# Patient Record
Sex: Female | Born: 1940 | Race: White | Hispanic: No | Marital: Married | Smoking: Never smoker
Health system: Southern US, Community
[De-identification: ages and names within clinical notes are randomized; demographics above are authoritative.]

## PROBLEM LIST (undated history)

## (undated) DIAGNOSIS — K222 Esophageal obstruction: Secondary | ICD-10-CM

## (undated) DIAGNOSIS — M1990 Unspecified osteoarthritis, unspecified site: Secondary | ICD-10-CM

## (undated) DIAGNOSIS — I8390 Asymptomatic varicose veins of unspecified lower extremity: Secondary | ICD-10-CM

## (undated) DIAGNOSIS — K449 Diaphragmatic hernia without obstruction or gangrene: Secondary | ICD-10-CM

## (undated) DIAGNOSIS — R42 Dizziness and giddiness: Secondary | ICD-10-CM

## (undated) DIAGNOSIS — K219 Gastro-esophageal reflux disease without esophagitis: Secondary | ICD-10-CM

## (undated) HISTORY — PX: BLADDER SUSPENSION: SHX72

## (undated) HISTORY — DX: Gastro-esophageal reflux disease without esophagitis: K21.9

## (undated) HISTORY — DX: Esophageal obstruction: K22.2

## (undated) HISTORY — PX: VAGINAL HYSTERECTOMY: SHX2639

## (undated) HISTORY — DX: Asymptomatic varicose veins of unspecified lower extremity: I83.90

## (undated) HISTORY — DX: Diaphragmatic hernia without obstruction or gangrene: K44.9

## (undated) HISTORY — DX: Dizziness and giddiness: R42

## (undated) HISTORY — DX: Unspecified osteoarthritis, unspecified site: M19.90

---

## 2007-04-24 ENCOUNTER — Ambulatory Visit: Payer: Self-pay | Admitting: Gastroenterology

## 2007-05-03 ENCOUNTER — Encounter: Payer: Self-pay | Admitting: Gastroenterology

## 2007-05-03 ENCOUNTER — Ambulatory Visit: Payer: Self-pay | Admitting: Gastroenterology

## 2007-05-03 DIAGNOSIS — K222 Esophageal obstruction: Secondary | ICD-10-CM | POA: Insufficient documentation

## 2007-06-26 DIAGNOSIS — K219 Gastro-esophageal reflux disease without esophagitis: Secondary | ICD-10-CM | POA: Insufficient documentation

## 2007-06-26 DIAGNOSIS — K449 Diaphragmatic hernia without obstruction or gangrene: Secondary | ICD-10-CM | POA: Insufficient documentation

## 2007-06-26 DIAGNOSIS — R42 Dizziness and giddiness: Secondary | ICD-10-CM | POA: Insufficient documentation

## 2007-06-26 DIAGNOSIS — J309 Allergic rhinitis, unspecified: Secondary | ICD-10-CM | POA: Insufficient documentation

## 2007-06-26 DIAGNOSIS — R1310 Dysphagia, unspecified: Secondary | ICD-10-CM | POA: Insufficient documentation

## 2008-06-03 ENCOUNTER — Encounter: Admission: RE | Admit: 2008-06-03 | Discharge: 2008-06-03 | Payer: Self-pay | Admitting: Family Medicine

## 2008-06-05 ENCOUNTER — Ambulatory Visit (HOSPITAL_COMMUNITY): Admission: RE | Admit: 2008-06-05 | Discharge: 2008-06-06 | Payer: Self-pay | Admitting: Obstetrics and Gynecology

## 2009-12-17 ENCOUNTER — Encounter (INDEPENDENT_AMBULATORY_CARE_PROVIDER_SITE_OTHER): Payer: Self-pay | Admitting: *Deleted

## 2010-01-06 ENCOUNTER — Ambulatory Visit: Payer: Self-pay | Admitting: Cardiology

## 2010-01-09 ENCOUNTER — Encounter (INDEPENDENT_AMBULATORY_CARE_PROVIDER_SITE_OTHER): Payer: Self-pay | Admitting: *Deleted

## 2010-06-30 NOTE — Letter (Signed)
Summary: Previsit letter  [facility]  [address] [place], [place] [address]   Phone: [phone]  Fax: [phone]       12/17/2009 MRN: [id number]  [facility] [address] [place], [place]  [address]  Dear Ms. [name] [name],  Welcome to the Gastroenterology Division at [facility].    You are scheduled to see a nurse for your pre-procedure visit on January 20, 2010 at 10:30am on the 3rd floor at [facility], [address]  We ask that you try to arrive at our office 15 minutes prior to your appointment time to allow for check-in.  Your nurse visit will consist of discussing your medical and surgical history, your immediate family medical history, and your medications.    Please bring a complete list of all your medications or, if you prefer, bring the medication bottles and we will list them.  We will need to be aware of both prescribed and over the counter drugs.  We will need to know exact dosage information as well.  If you are on blood thinners (Coumadin, Plavix, Aggrenox, Ticlid, etc.) please call our office today/prior to your appointment, as we need to consult with your physician about holding your medication.   Please be prepared to read and sign documents such as consent forms, a financial agreement, and acknowledgement forms.  If necessary, and with your consent, a friend or relative is welcome to sit-in on the nurse visit with you.  Please bring your insurance card so that we may make a copy of it.  If your insurance requires a referral to see a specialist, please bring your referral form from your primary care physician.  No co-pay is required for this nurse visit.     If you cannot keep your appointment, please call [phone] to cancel or reschedule prior to your appointment date.  This allows [place] the opportunity to schedule an appointment for another patient in need of care.    Thank you for choosing [facility] for your medical  needs.  We appreciate the opportunity to care for you.  Please visit [place] at our website  to learn more about our practice.                     Sincerely.                                                                                                                   The Gastroenterology Division

## 2010-06-30 NOTE — Letter (Signed)
Summary: [facility] Cancel and No Reschedule  [facility]  [address] [place], [place] [address]   Phone: [phone]  Fax: [phone]      January 09, 2010 MRN: [id number]   [facility] [address] [place], [place]  [address]     You recently cancelled your endoscopic procedure at the [facility] and did not reschedule for another date.    Your provider recommended this procedure for the benefit of your health.  It is very important that you reschedule it.  Failure to do so may be to the detriment of your health.  Please call us at [phone] and we will be happy to assist you with rescheduling.    If you were referred for this procedure by another physician/provider, we will notify him/her that you did not keep your appointment.   Sincerely,   Endoscopy Center

## 2010-06-30 NOTE — Procedures (Signed)
Summary: Gastroenterology EGD  Gastroenterology EGD   Imported By: Christie Nottingham 06/29/2007 08:13:42  _____________________________________________________________________  External Attachment:    Type:   Image     Comment:   External Document

## 2010-09-14 LAB — CBC
HCT: 33.4 % — ABNORMAL LOW (ref 36.0–46.0)
HCT: 40.3 % (ref 36.0–46.0)
Hemoglobin: 11.4 g/dL — ABNORMAL LOW (ref 12.0–15.0)
Hemoglobin: 13.5 g/dL (ref 12.0–15.0)
MCHC: 33.5 g/dL (ref 30.0–36.0)
MCHC: 34.1 g/dL (ref 30.0–36.0)
MCV: 95 fL (ref 78.0–100.0)
MCV: 95.4 fL (ref 78.0–100.0)
Platelets: 223 10*3/uL (ref 150–400)
Platelets: 257 10*3/uL (ref 150–400)
RBC: 3.5 MIL/uL — ABNORMAL LOW (ref 3.87–5.11)
RBC: 4.24 MIL/uL (ref 3.87–5.11)
RDW: 13 % (ref 11.5–15.5)
RDW: 13.2 % (ref 11.5–15.5)
WBC: 15.1 10*3/uL — ABNORMAL HIGH (ref 4.0–10.5)
WBC: 7.6 10*3/uL (ref 4.0–10.5)

## 2010-10-13 NOTE — Op Note (Signed)
NAME:  [name] [name], [name] [name]                  ACCOUNT NO.:  [id number]   MEDICAL RECORD NO.:  [id number]          PATIENT TYPE:  OIB   LOCATION:  9302                          FACILITY:  [facility]   PHYSICIAN:  Leighton Roach Meisinger, M.D.DATE OF BIRTH:  05-Oct-1940   DATE OF PROCEDURE:  DATE OF DISCHARGE:                               OPERATIVE REPORT   PREOPERATIVE DIAGNOSES:  Cystocele and rectocele.   POSTOPERATIVE DIAGNOSES:  Cystocele and rectocele.   PROCEDURE:  Anterior and posterior colporrhaphy and cystoscopy.   SURGEON:  Zenaida Niece, MD   ASSISTANT:  Malachi Pro. Ambrose Mantle, MD   ANESTHESIA:  General with an LMA.   FINDINGS:  She had a grade 2 cystocele that was more toward the vaginal  cuff and a grade 1 rectocele.   SPECIMENS:  None.   ESTIMATED BLOOD LOSS:  200 mL.   COMPLICATIONS:  None.   PROCEDURE IN DETAIL:  The patient was taken to the operating room and  placed in the dorsal supine position.  General anesthesia was induced  and she was placed in mobile stirrups.  Perineum and vagina were then  prepped and draped in the usual sterile fashion and bladder drained with  a latex free catheter.  A short weighted speculum was then inserted into  the vagina and a Deaver retractor used anteriorly.  The vaginal cuff was  a little bit difficult to identify, but I grabbed what I thought was the  cuff with two Allis clamps.  A piece of vaginal mucosa was then removed  sharply.  The anterior vagina was then dissected in the midline from the  vaginal cuff within 2 cm of the urethral meatus.  This was done in the  midline.  Cystocele was then mobilized laterally with sharp and blunt  dissection.  There was a fairly large bulge towards the vaginal cuff  that appeared to be possibly an enterocele.  It was difficult to  identify what it was.  At this point, cystoscopy was performed with a 70-  degree cystoscope.  The bladder was instilled with approximately 200 mL  of sterile fluid.  While  looking in the bladder and pushing up on this  bulge, it was apparent that it was part of the cystocele.  The  cystoscope was removed after the bladder was drained.  The cystocele was  then reduced with interrupted sutures of 2-0 Vicryl with good reduction.  Bleeding was controlled with electrocautery.  Excess vaginal mucosa was  then removed sharply.  The anterior vaginal mucosa was then closed in a  running locking fashion with 2-0 Vicryl.  A small amount of bleeding at  the vaginal cuff was controlled with a figure-of-eight suture of 2-0  Vicryl.  This appeared to have good reduction of the cystocele and  adequate hemostasis.   Attention was turned posteriorly.  There did appear to be a grade 1  rectocele.  The hymenal ring was grasped with two Allis clamps.  A piece  of mucosa was then removed sharply.  The vaginal mucosa was dissected  from the hymenal ring to  the vaginal cuff in the midline.  Rectocele was  then mobilized laterally both sharply and bluntly.  A rectal exam  confirmed that this was a small rectocele and no evidence of enterocele.  The rectocele was then reduced with interrupted sutures of 2-0 Vicryl.  Vaginal mucosa was then trimmed sharply.  Bleeding was controlled with  electrocautery.  Vaginal mucosa was then closed with running locking 2-0  Vicryl with adequate closure and adequate hemostasis.  No significant  bleeding was noted.  Cystoscopy was again performed and noted good  reduction of the cystocele and urine was seen to flow from each ureteral  orifice.  Cystoscope was removed and a Foley catheter was placed.  The  vagina was then packed with 2-inch gauze with estrogen cream on it.  All  instruments were then removed from the vagina.  The patient was taken  down from stirrups.  She was awakened in the operating room and taken to  the recovery room in stable condition after tolerating the procedure  well.  Counts were correct, she received Ancef 1 g IV at the  beginning  of the procedure and had PAS hose on throughout the procedure.      Zenaida Niece, M.D.  Electronically Signed     TDM/MEDQ  D:  06/05/2008  T:  06/05/2008  Job:  [id number]

## 2010-10-13 NOTE — H&P (Signed)
NAME:  [name] [name], [name] [name]                  ACCOUNT NO.:  [id number]   MEDICAL RECORD NO.:  [id number]          PATIENT TYPE:  AMB   LOCATION:  SDC                           FACILITY:  [facility]   PHYSICIAN:  Zenaida Niece, M.D.DATE OF BIRTH:  [birth year]   DATE OF ADMISSION:  DATE OF DISCHARGE:                              HISTORY & PHYSICAL   CHIEF COMPLAINT:  Cystocele.   HISTORY OF PRESENT ILLNESS:  This is a 70 year old female, gravida 4,  para 3-0-1-3, whom I saw for an annual exam in April 2009.  At that  time, she was complaining of increased urinary urgency with leaking and  the fact that she was leaking with bending over and with increasing  pelvic pressure.  On physical exam, she was noted to have a grade 2+  cystocele.  Options were discussed and at that point the patient wished  to think about it.  Her urinalysis was normal and she was thought to  probably have overactive bladder.  She was placed on Detrol and this did  help some with her symptoms.  She called in October 2009, saying that  she wanted to have surgery for her urine leakage.  She had urodynamics  performed which were more consistent with overactive bladder and not  consistent with stress incontinence.  I advised her that her cystocele  may be contributing to her overactive bladder and offered again pessary  or surgery and she is being admitted for repair of her cystocele.   PAST OB HISTORY:  Significant for three vaginal deliveries.   PAST MEDICAL HISTORY:  1. Arthritis.  2. Gastroesophageal reflux disease.  3. Esophageal restriction.   PAST SURGICAL HISTORY:  Previous vaginal hysterectomy with anterior  repair.   ALLERGIES:  STREPTOMYCIN.   CURRENT MEDICATIONS:  Detrol and Nexium, as well as vitamins.   REVIEW OF SYSTEMS:  No chest pain or shortness of breath.  She does have  some increased frequency with bowel movements.   FAMILY HISTORY:  Sister with breast cancer and niece with colon cancer.   SOCIAL  HISTORY:  She denies alcohol, tobacco or drug use and she is  married.   PHYSICAL EXAMINATION:  GENERAL:  This is a well-developed female in no  acute distress.  Weight is 173 pounds.  Blood pressure was 110/80.  NECK:  Supple without lymphadenopathy or thyromegaly.  LUNGS:  Clear to auscultation.  HEART:  Regular rate and rhythm without murmur.  ABDOMEN:  Soft, nontender, nondistended without palpable masses.  EXTREMITIES:  Have no edema and are nontender.  PELVIC EXAM:  External genitalia has no lesions.  On speculum exam, the  vagina is normal except for a grade 2+ cystocele and some atrophy.  On  bimanual exam there are no masses.  She is nontender and she does have a  poor Kegel contraction.   ASSESSMENT:  Symptomatic cystocele with overactive bladder.  All  nonsurgical and surgical options have been discussed with the patient  and she wishes to proceed with surgical therapy.  All risks of surgery  have been discussed.   PLAN:  The plan is to admit the patient on the day of surgery for  anterior colporrhaphy and posterior colporrhaphy if needed.      Zenaida Niece, M.D.  Electronically Signed     TDM/MEDQ  D:  06/04/2008  T:  06/04/2008  Job:  [id number]

## 2010-10-13 NOTE — Assessment & Plan Note (Signed)
[name] [name]                         GASTROENTEROLOGY OFFICE NOTE   NAME:[name], [name] GHRIST                         MRN:          [id number]  DATE:04/24/2007                            DOB:          1940/10/06    REFERRING PHYSICIAN:  Quita Skye. Artis Flock, M.D.   REASON FOR CONSULTATION:  Dysphagia and reflux symptoms.   HISTORY OF PRESENT ILLNESS:  [name] [name] is a 70 year old white female who  relates reflux problems since 1998. She states that she had an upper  endoscopy or an upper GI series performed in about 1998 that showed a  hiatal hernia. She has intermittent problems with nighttime reflux  generally related to eating late at night or eating spicy foods. These  problems have been relatively stable for many years and she has not used  a routine medication for management. Over the past 12 months, she has  had worsening problems with solid food dysphagia. Her reflux symptoms  have not substantially changed. She saw Dr. Artis Flock with the above  complaints approximately two weeks ago and she was started on Nexium and  she has had an improvement in all of her symptoms. She notes no  odynophagia, weight loss, nausea, vomiting, abdominal pain, melena,  hematochezia or change in stool caliber. There is no family history of  colon cancer or colon polyps or inflammatory bowel disease.   PAST MEDICAL HISTORY:  1. Allergic rhinitis.  2. Vertigo.  3. Gastroesophageal reflux disease.  4. Hiatal hernia.   CURRENT MEDICATIONS:  As listed on the chart; updated and reviewed.   MEDICATION ALLERGIES:  STREPTOMYCIN LEADING TO A RASH.   SOCIAL HISTORY:  Per the handwritten form.   REVIEW OF SYSTEMS:  Per the handwritten form.   PHYSICAL EXAMINATION:  Well-developed, well-nourished white female in no  acute distress. Height 5 feet, 2 inches. Weight 176.6 pounds. Blood  pressure is 110/60, pulse 78 and regular.  HEENT: Anicteric sclerae. Oropharynx clear.  CHEST: Clear  to auscultation bilaterally.  CARDIAC: Regular rate and rhythm without murmurs appreciated.  ABDOMEN: Soft and nontender. Nondistended. Normoactive bowel sounds. No  palpable organomegaly, masses or hernias.  RECTAL: Deferred.  EXTREMITIES: Without clubbing, cyanosis or edema.  NEUROLOGIC: Alert and oriented x3. Grossly nonfocal.   ASSESSMENT/PLAN:  1. Gastroesophageal reflux disease with predominantly nighttime      symptoms and solid food dysphagia, rule out an esophageal      stricture, esophagitis, Barretts and other disorders. Continue      Nexium 40 mg p.o. q a.m. along with standard anti-reflux measures.      Risks, benefits and alternatives to upper endoscopy with possible      biopsy and possible dilatation discussed with the patient. She      consents to proceed. This will be scheduled electively.  2. Colorectal cancer screening. Will discuss a screening colonoscopy      with her at the time of her next visit.     Venita Lick. Russella Dar, MD, [facility]  Electronically Signed    MTS/MedQ  DD: 04/24/2007  DT: 04/24/2007  Job #: [id number]   cc:  Quita Skye Artis Flock, M.D.

## 2011-01-04 ENCOUNTER — Ambulatory Visit: Payer: Medicare Other | Attending: Otolaryngology | Admitting: Rehabilitative and Restorative Service Providers"

## 2011-01-04 DIAGNOSIS — R269 Unspecified abnormalities of gait and mobility: Secondary | ICD-10-CM | POA: Insufficient documentation

## 2011-01-04 DIAGNOSIS — R42 Dizziness and giddiness: Secondary | ICD-10-CM | POA: Insufficient documentation

## 2011-01-04 DIAGNOSIS — IMO0001 Reserved for inherently not codable concepts without codable children: Secondary | ICD-10-CM | POA: Insufficient documentation

## 2011-01-07 ENCOUNTER — Ambulatory Visit: Payer: Medicare Other | Admitting: Rehabilitative and Restorative Service Providers"

## 2011-01-12 ENCOUNTER — Encounter: Payer: Medicare Other | Admitting: Rehabilitative and Restorative Service Providers"

## 2011-01-14 ENCOUNTER — Ambulatory Visit: Payer: Medicare Other | Admitting: Physical Therapy

## 2011-01-19 ENCOUNTER — Ambulatory Visit: Payer: Medicare Other | Admitting: Rehabilitative and Restorative Service Providers"

## 2011-01-26 ENCOUNTER — Encounter: Payer: Medicare Other | Admitting: Rehabilitative and Restorative Service Providers"

## 2011-03-30 ENCOUNTER — Ambulatory Visit: Payer: Medicare Other | Admitting: Cardiology

## 2011-04-26 ENCOUNTER — Telehealth: Payer: Self-pay | Admitting: Cardiology

## 2011-04-26 ENCOUNTER — Ambulatory Visit: Payer: Medicare Other | Admitting: Cardiology

## 2011-04-26 NOTE — Telephone Encounter (Signed)
Started yesterday with cold chills, fever last night, some diarrhea,  and headache.  Denies fever today, cough, congestion, or sore throat.  Stated she did get the flu vaccine.  Advised to continue to drink plenty of fluids, use Tylenol as needed and if worse go to urgent care

## 2011-04-26 NOTE — Telephone Encounter (Signed)
Agree with plan 

## 2011-04-26 NOTE — Telephone Encounter (Signed)
New Msg: Pt calling to cancel appt for today stating that pt has virus. Pt wanted to know if rx could be called in to assist with pt getting over virus. Please return pt call to discuss further if necessary.

## 2011-04-27 ENCOUNTER — Ambulatory Visit: Payer: Medicare Other | Admitting: Cardiology

## 2011-04-27 ENCOUNTER — Other Ambulatory Visit: Payer: Self-pay | Admitting: Cardiology

## 2011-04-27 DIAGNOSIS — R197 Diarrhea, unspecified: Secondary | ICD-10-CM

## 2011-04-27 MED ORDER — DIPHENOXYLATE-ATROPINE 2.5-0.025 MG PO TABS: 1.0000 | ORAL_TABLET | Freq: Four times a day (QID) | ORAL | Status: AC | PRN

## 2011-04-27 NOTE — Telephone Encounter (Signed)
New message:  Pt has been sick since Sunday.  She would like to know what she can take for the diarrhea.  She has been taking Imodium but not much better.  Please call and advise what she can take

## 2011-04-27 NOTE — Telephone Encounter (Signed)
Try Lomotil one every 4 hours when necessary for diarrhea #20.  If she fails to improve she will need to go to urgent care or to her primary care provider

## 2011-04-27 NOTE — Telephone Encounter (Signed)
Advised and called to pharmacy

## 2011-04-27 NOTE — Telephone Encounter (Signed)
Please advise 

## 2011-04-28 ENCOUNTER — Ambulatory Visit: Payer: Medicare Other | Admitting: Cardiology

## 2011-05-31 ENCOUNTER — Encounter: Payer: Self-pay | Admitting: Cardiology

## 2011-06-02 ENCOUNTER — Encounter: Payer: Self-pay | Admitting: Cardiology

## 2011-06-07 ENCOUNTER — Ambulatory Visit (INDEPENDENT_AMBULATORY_CARE_PROVIDER_SITE_OTHER): Payer: Medicare Other | Admitting: Cardiology

## 2011-06-07 ENCOUNTER — Encounter: Payer: Self-pay | Admitting: Cardiology

## 2011-06-07 VITALS — BP 100/60 | HR 80 | Ht 62.0 in | Wt 166.0 lb

## 2011-06-07 DIAGNOSIS — E7800 Pure hypercholesterolemia, unspecified: Secondary | ICD-10-CM | POA: Insufficient documentation

## 2011-06-07 NOTE — Assessment & Plan Note (Signed)
The patient has a past history of hypercholesterolemia.  The last time we checked it was in July 2011 at which time her LDL cholesterol was 152.  She had a prior [place] and MR study showing that her LDL was a large type.  She has not had to be on statin therapy.  Does not have a history of diabetes.  Her parents did not have premature coronary disease.  Her father died of kidney failure at age 73 her mother died at age 91 of old age

## 2011-06-07 NOTE — Progress Notes (Signed)
[name] [name] Date of Birth:  06-May-1941 [facility] [address] [place], [place]  [address] [phone]         Fax   [phone]  History of Present Illness: This pleasant 71 year old woman is seen for a scheduled followup office visit she has been in good general health.  She does have a history of high cholesterol.  She does not have any history of known ischemic heart disease.  Her last saw her in August 2011.  He does have a past history of severe vertigo.  Does not have any history palpitations or syncope.  No history of congestive heart failure.  Current Outpatient Prescriptions  Medication Sig Dispense Refill  . Cholecalciferol (VITAMIN D PO) Take by mouth daily.        Marland Kitchen CRANBERRY EXTRACT PO Take by mouth.        . esomeprazole (NEXIUM) 40 MG capsule Take 40 mg by mouth daily before breakfast.        . Multiple Vitamin (STRESS B PO) Take by mouth. Taking 3 daily       . tolterodine (DETROL) 1 MG tablet Take 1 mg by mouth daily.          Allergies  Allergen Reactions  . Streptomycin     Patient Active Problem List  Diagnoses  . ALLERGIC RHINITIS  . ESOPHAGEAL STRICTURE  . GASTROESOPHAGEAL REFLUX DISEASE  . HIATAL HERNIA  . VERTIGO  . DYSPHAGIA UNSPECIFIED  . Pure hypercholesterolemia    History  Smoking status  . Never Smoker   Smokeless tobacco  . Not on file    History  Alcohol Use No    Family History  Problem Relation Age of Onset  . Cancer Sister     Breast Cancer    Review of Systems: Constitutional: no fever chills diaphoresis or fatigue or change in weight.  Head and neck: no hearing loss, no epistaxis, no photophobia or visual disturbance. Respiratory: No cough, shortness of breath or wheezing. Cardiovascular: No chest pain peripheral edema, palpitations. Gastrointestinal: No abdominal distention, no abdominal pain, no change in bowel habits hematochezia or melena. Genitourinary: No dysuria, no frequency, no  urgency, no nocturia. Musculoskeletal:No arthralgias, no back pain, no gait disturbance or myalgias. Neurological: No dizziness, no headaches, no numbness, no seizures, no syncope, no weakness, no tremors. Hematologic: No lymphadenopathy, no easy bruising. Psychiatric: No confusion, no hallucinations, no sleep disturbance.    Physical Exam: Filed Vitals:   06/07/11 1521  BP: 100/60  Pulse: 80   the general appearance reveals a well-developed well-nourished woman in no distress.Pupils equal and reactive.   Extraocular Movements are full.  There is no scleral icterus.  The mouth and pharynx are normal.  The neck is supple.  The carotids reveal no bruits.  The jugular venous pressure is normal.  The thyroid is not enlarged.  There is no lymphadenopathy.  The chest is clear to percussion and auscultation. There are no rales or rhonchi. Expansion of the chest is symmetrical.  The precordium is quiet.  The first heart sound is normal.  The second heart sound is physiologically split.  There is no murmur gallop rub or click.  There is no abnormal lift or heave.  The abdomen is soft and nontender. Bowel sounds are normal. The liver and spleen are not enlarged. There Are no abdominal masses. There are no bruits.  The pedal pulses are good.  There is no phlebitis or edema.  There is no cyanosis  or clubbing. Strength is normal and symmetrical in all extremities.  There is no lateralizing weakness.  There are no sensory deficits.  The skin is warm and dry.  There is no rash.  EKG today shows normal sinus rhythm and nonspecific T-wave abnormalities not significantly changed from the previous EKG of 12/09/09  Assessment / Plan: We're checking blood work today including lipid panel hepatic function panel is a metabolic panel and CBC.  She is to work harder on weight loss and low cholesterol diet.  She is to be rechecked in one year for followup office visit and EKG.  No new meds are prescribed at this  point.

## 2011-06-07 NOTE — Patient Instructions (Signed)
Will obtain labs today and call you with the results Your physician recommends that you continue on your current medications as directed. Please refer to the Current Medication list given to you today. Your physician wants you to follow-up in: 1 year You will receive a reminder letter in the mail two months in advance. If you don't receive a letter, please call our office to schedule the follow-up appointment.

## 2011-06-08 LAB — CBC WITH DIFFERENTIAL/PLATELET
Basophils Absolute: 0 10*3/uL (ref 0.0–0.1)
Basophils Relative: 0.5 % (ref 0.0–3.0)
Eosinophils Absolute: 0.1 10*3/uL (ref 0.0–0.7)
Eosinophils Relative: 1.5 % (ref 0.0–5.0)
HCT: 40.8 % (ref 36.0–46.0)
Hemoglobin: 13.9 g/dL (ref 12.0–15.0)
Lymphocytes Relative: 27 % (ref 12.0–46.0)
Lymphs Abs: 2.4 10*3/uL (ref 0.7–4.0)
MCHC: 33.9 g/dL (ref 30.0–36.0)
MCV: 93.4 fl (ref 78.0–100.0)
Monocytes Absolute: 0.7 10*3/uL (ref 0.1–1.0)
Monocytes Relative: 7.6 % (ref 3.0–12.0)
Neutro Abs: 5.6 10*3/uL (ref 1.4–7.7)
Neutrophils Relative %: 63.4 % (ref 43.0–77.0)
Platelets: 248 10*3/uL (ref 150.0–400.0)
RBC: 4.37 Mil/uL (ref 3.87–5.11)
RDW: 13.9 % (ref 11.5–14.6)
WBC: 8.8 10*3/uL (ref 4.5–10.5)

## 2011-06-08 LAB — BASIC METABOLIC PANEL
BUN: 10 mg/dL (ref 6–23)
CO2: 29 mEq/L (ref 19–32)
Calcium: 9.4 mg/dL (ref 8.4–10.5)
Chloride: 105 mEq/L (ref 96–112)
Creatinine, Ser: 0.7 mg/dL (ref 0.4–1.2)
GFR: 88.81 mL/min (ref >60.00)
Glucose, Bld: 103 mg/dL — ABNORMAL HIGH (ref 70–99)
Potassium: 3.8 mEq/L (ref 3.5–5.1)
Sodium: 140 mEq/L (ref 135–145)

## 2011-06-08 LAB — HEPATIC FUNCTION PANEL
ALT: 27 U/L (ref 0–35)
AST: 36 U/L (ref 0–37)
Albumin: 4.2 g/dL (ref 3.5–5.2)
Alkaline Phosphatase: 65 U/L (ref 39–117)
Bilirubin, Direct: 0.1 mg/dL (ref 0.0–0.3)
Total Bilirubin: 0.5 mg/dL (ref 0.3–1.2)
Total Protein: 7.7 g/dL (ref 6.0–8.3)

## 2011-06-08 LAB — LIPID PANEL
Cholesterol: 213 mg/dL — ABNORMAL HIGH (ref 0–200)
HDL: 54.5 mg/dL (ref >39.00)
Total CHOL/HDL Ratio: 4
Triglycerides: 97 mg/dL (ref 0.0–149.0)
VLDL: 19.4 mg/dL (ref 0.0–40.0)

## 2011-06-08 LAB — LDL CHOLESTEROL, DIRECT: Direct LDL: 137 mg/dL

## 2011-06-15 ENCOUNTER — Telehealth: Payer: Self-pay | Admitting: *Deleted

## 2011-06-15 NOTE — Telephone Encounter (Signed)
Message copied by Burnell Blanks on Tue Jun 15, 2011  2:30 PM ------      Message from: Cassell Clement      Created: Tue Jun 08, 2011  7:47 PM       Labs satisfactory except BS and cholesterol and LDL each slightly high.  Work harder on diet, exercise and weight loss.

## 2011-06-15 NOTE — Telephone Encounter (Signed)
Mailed copy of labs and left message to call if any questions  

## 2011-06-16 ENCOUNTER — Telehealth: Payer: Self-pay | Admitting: Cardiology

## 2011-07-14 ENCOUNTER — Telehealth: Payer: Self-pay | Admitting: Cardiology

## 2011-07-14 DIAGNOSIS — R197 Diarrhea, unspecified: Secondary | ICD-10-CM

## 2011-07-14 NOTE — Telephone Encounter (Signed)
Since early yesterday am.  Denies black stools or pain.  Stools are liquid. Not going to restroom a lot, but not really eating anything solid.  Denies nausea or vomiting. Will forward to  Dr. Patty Sermons for review.  Uses [facility] [address], [place].

## 2011-07-14 NOTE — Telephone Encounter (Signed)
Fu call °Pt calling back again °

## 2011-07-14 NOTE — Telephone Encounter (Signed)
Please call Lomotil one every 4 hours when necessary for severe diarrhea #15

## 2011-07-14 NOTE — Telephone Encounter (Signed)
Advised patient and called in to pharmacy Advised patient if no better needs to be evaluated by physician at [facility] where she is.  Verbalized understanding

## 2011-07-14 NOTE — Telephone Encounter (Signed)
Agree 

## 2011-07-14 NOTE — Telephone Encounter (Signed)
New Msg: pt calling c/o diarrhea since yesterday, last night and today. Pt stated she doesn't know if she has a virus and wants to know if Dr. Patty Sermons can call in a RX for pt. Please return pt call to discuss further.

## 2011-07-15 MED ORDER — DIPHENOXYLATE-ATROPINE 2.5-0.025 MG PO TABS: 1.0000 | ORAL_TABLET | Freq: Four times a day (QID) | ORAL | Status: AC | PRN

## 2011-07-30 ENCOUNTER — Telehealth: Payer: Self-pay | Admitting: Cardiology

## 2011-07-30 NOTE — Telephone Encounter (Signed)
Pt hurt her knee while on vacation and wants to see Dr. Patty Sermons I explained he is no longer doing PCP care and she wants to talk to a nurse anyway

## 2011-08-02 NOTE — Telephone Encounter (Signed)
Patient stated knee is better,did go to urgent care

## 2011-10-24 ENCOUNTER — Encounter: Payer: Self-pay | Admitting: Cardiology

## 2011-11-01 ENCOUNTER — Encounter: Payer: Self-pay | Admitting: Cardiology

## 2012-05-12 ENCOUNTER — Encounter: Payer: Self-pay | Admitting: Cardiology

## 2012-07-11 ENCOUNTER — Telehealth: Payer: Self-pay | Admitting: Gastroenterology

## 2012-07-11 NOTE — Telephone Encounter (Signed)
Patient will come in and see Dr. Russella Dar on 07/26/12 10:45

## 2012-07-26 ENCOUNTER — Ambulatory Visit (INDEPENDENT_AMBULATORY_CARE_PROVIDER_SITE_OTHER): Payer: Medicare Other | Admitting: Gastroenterology

## 2012-07-26 ENCOUNTER — Encounter: Payer: Self-pay | Admitting: Gastroenterology

## 2012-07-26 VITALS — BP 104/62 | HR 84 | Ht 61.25 in | Wt 163.1 lb

## 2012-07-26 DIAGNOSIS — R1319 Other dysphagia: Secondary | ICD-10-CM

## 2012-07-26 MED ORDER — OMEPRAZOLE 20 MG PO CPDR: 20.0000 mg | DELAYED_RELEASE_CAPSULE | Freq: Every day | ORAL | Status: DC

## 2012-07-26 NOTE — Patient Instructions (Addendum)
You have been scheduled for an endoscopy with propofol. Please follow written instructions given to you at your visit today. If you use inhalers (even only as needed) or a CPAP machine, please bring them with you on the day of your procedure.  We have sent the following medications to your pharmacy for you to pick up at your convenience: Omeprazole.   Call me back with a name of your prior Gastroenterologist so we can get records.  Thank you for choosing me and [facility].  Venita Lick. Pleas Koch., MD., Clementeen Graham  cc: [name], MD

## 2012-07-26 NOTE — Progress Notes (Signed)
History of Present Illness: This is a 72 year old female with a history of GERD and peptic stricture who relates solid food dysphasia for the past year. She underwent endoscopy with dilation in 2008. She has modifyied her diet substantially but still has episodes of solid food dysphagia. She states she has intermittent diarrhea and urgency. She states she underwent colonoscopy in [place] in January 2013 and her symptoms began at that time. She recalls having problems with vomiting either during or immediately following her colonoscopy. She does not recall any abnormal findings on her colonoscopy. Unfortunately do not have those records today. Denies weight loss, abdominal pain, constipation, change in stool caliber, melena, hematochezia, nausea, vomiting, reflux symptoms, chest pain.  Review of Systems: Pertinent positive and negative review of systems were noted in the above HPI section. All other review of systems were otherwise negative.  Current Medications, Allergies, Past Medical History, Past Surgical History, Family History and Social History were reviewed in [employer] record.  Physical Exam: General: Well developed , well nourished, no acute distress Head: Normocephalic and atraumatic Eyes:  sclerae anicteric, EOMI Ears: Normal auditory acuity Mouth: No deformity or lesions Neck: Supple, no masses or thyromegaly Lungs: Clear throughout to auscultation Heart: Regular rate and rhythm; no murmurs, rubs or bruits Abdomen: Soft, non tender and non distended. No masses, hepatosplenomegaly or hernias noted. Normal Bowel sounds Musculoskeletal: Symmetrical with no gross deformities  Skin: No lesions on visible extremities Pulses:  Normal pulses noted Extremities: No clubbing, cyanosis, edema or deformities noted Neurological: Alert oriented x 4, grossly nonfocal Cervical Nodes:  No significant cervical adenopathy Inguinal Nodes: No significant inguinal  adenopathy Psychological:  Alert and cooperative. Normal mood and affect  Assessment and Recommendations:  1. Solid food dysphagia. Resume recurrent esophageal stricture. Begin standard antireflux measures and omeprazole 20 mg daily. Schedule endoscopy with dilation. The risks, benefits, and alternatives to endoscopy with possible biopsy and possible dilation were discussed with the patient and they consent to proceed.   2. Intermittent diarrhea with fecal urgency. Attempt to obtain records from her prior colonoscopy.

## 2012-08-08 ENCOUNTER — Ambulatory Visit (AMBULATORY_SURGERY_CENTER): Payer: Medicare Other | Admitting: Gastroenterology

## 2012-08-08 ENCOUNTER — Encounter: Payer: Self-pay | Admitting: Gastroenterology

## 2012-08-08 VITALS — BP 114/70 | HR 66 | Temp 98.2°F | Resp 21 | Ht 61.25 in | Wt 163.0 lb

## 2012-08-08 DIAGNOSIS — K222 Esophageal obstruction: Secondary | ICD-10-CM

## 2012-08-08 DIAGNOSIS — R1319 Other dysphagia: Secondary | ICD-10-CM

## 2012-08-08 DIAGNOSIS — K219 Gastro-esophageal reflux disease without esophagitis: Secondary | ICD-10-CM

## 2012-08-08 MED ORDER — SODIUM CHLORIDE 0.9 % IV SOLN: 500.0000 mL | INTRAVENOUS | Status: DC

## 2012-08-08 NOTE — Progress Notes (Signed)
Called to room to assist during endoscopic procedure.  Patient ID and intended procedure confirmed with present staff. Received instructions for my participation in the procedure from the performing physician.  

## 2012-08-08 NOTE — Op Note (Signed)
[facility] [address] [place], [address]   ENDOSCOPY PROCEDURE REPORT  PATIENT: [name] [name], [name] [name]  MR#: [id number] BIRTHDATE: [birth year] , 71  yrs. old GENDER: Female ENDOSCOPIST: Meryl Dare, MD, [facility] PROCEDURE DATE:  08/08/2012 PROCEDURE:  EGD, diagnostic and Savary dilation of esophagus ASA CLASS:     Class II INDICATIONS:  Dysphagia. MEDICATIONS: MAC sedation, administered by CRNA and propofol (Diprivan) 150mg  IV TOPICAL ANESTHETIC: none DESCRIPTION OF PROCEDURE: After the risks benefits and alternatives of the procedure were thoroughly explained, informed consent was obtained.  The LB-GIF Q180 [id number] endoscope was introduced through the mouth and advanced to the second portion of the duodenum without limitations.  The instrument was slowly withdrawn as the mucosa was fully examined.  ESOPHAGUS: A benign appearing stricture was found at the gastroesophageal junction.  The stenosis was traversable with the endoscope.   The esophagus was otherwise normal. STOMACH:  Medium sized linear erosions were found in the gastric fundus.   The stomach otherwise appeared normal. DUODENUM: The duodenal mucosa showed no abnormalities in the bulb and second portion of the duodenum.  Retroflexed views revealed a 5 cm hiatal hernia.  A guidewire was placed and the scope was then withdrawn from the patient. Savary 13, 14 and 15 mm dilators were passed over the guidewire with minimal resistance and no heme noted and the procedure was completed.  COMPLICATIONS: There were no complications.  ENDOSCOPIC IMPRESSION: 1.   Stricture at the gastroesophageal junction 2.   Moderate sized hiatal hernia 3.   Cameron erosions  RECOMMENDATIONS: 1.  Anti-reflux regimen long term 2.  Continue PPI long term 3.  post dilation instructions   eSigned:  Meryl Dare, MD, [facility] 08/08/2012 9:17 AM   XB:[id number] Tiburcio Pea, MD

## 2012-08-08 NOTE — Progress Notes (Signed)
Report to pacu rn, vss, bbs=clear 

## 2012-08-08 NOTE — Patient Instructions (Addendum)
Discharge instructions given with verbal understanding. Handouts on a hiatal hernia and a dilatation diet given. Resume previous medications. YOU HAD AN ENDOSCOPIC PROCEDURE TODAY AT THE [facility]: Refer to the procedure report that was given to you for any specific questions about what was found during the examination.  If the procedure report does not answer your questions, please call your gastroenterologist to clarify.  If you requested that your care partner not be given the details of your procedure findings, then the procedure report has been included in a sealed envelope for you to review at your convenience later.  YOU SHOULD EXPECT: Some feelings of bloating in the abdomen. Passage of more gas than usual.  Walking can help get rid of the air that was put into your GI tract during the procedure and reduce the bloating. If you had a lower endoscopy (such as a colonoscopy or flexible sigmoidoscopy) you may notice spotting of blood in your stool or on the toilet paper. If you underwent a bowel prep for your procedure, then you may not have a normal bowel movement for a few days.  DIET: Your first meal following the procedure should be a light meal and then it is ok to progress to your normal diet.  A half-sandwich or bowl of soup is an example of a good first meal.  Heavy or fried foods are harder to digest and may make you feel nauseous or bloated.  Likewise meals heavy in dairy and vegetables can cause extra gas to form and this can also increase the bloating.  Drink plenty of fluids but you should avoid alcoholic beverages for 24 hours.  ACTIVITY: Your care partner should take you home directly after the procedure.  You should plan to take it easy, moving slowly for the rest of the day.  You can resume normal activity the day after the procedure however you should NOT DRIVE or use heavy machinery for 24 hours (because of the sedation medicines used during the test).    SYMPTOMS TO  REPORT IMMEDIATELY: A gastroenterologist can be reached at any hour.  During normal business hours, 8:30 AM to 5:00 PM Monday through Friday, call [phone].  After hours and on weekends, please call the GI answering service at [phone] who will take a message and have the physician on call contact you.   Following upper endoscopy (EGD)  Vomiting of blood or coffee ground material  New chest pain or pain under the shoulder blades  Painful or persistently difficult swallowing  New shortness of breath  Fever of 100F or higher  Black, tarry-looking stools  FOLLOW UP: If any biopsies were taken you will be contacted by phone or by letter within the next 1-3 weeks.  Call your gastroenterologist if you have not heard about the biopsies in 3 weeks.  Our staff will call the home number listed on your records the next business day following your procedure to check on you and address any questions or concerns that you may have at that time regarding the information given to you following your procedure. This is a courtesy call and so if there is no answer at the home number and we have not heard from you through the emergency physician on call, we will assume that you have returned to your regular daily activities without incident.  SIGNATURES/CONFIDENTIALITY: You and/or your care partner have signed paperwork which will be entered into your electronic medical record.  These signatures attest to the fact  that that the information above on your After Visit Summary has been reviewed and is understood.  Full responsibility of the confidentiality of this discharge information lies with you and/or your care-partner.

## 2012-08-09 ENCOUNTER — Telehealth: Payer: Self-pay

## 2012-08-09 NOTE — Telephone Encounter (Signed)
Left message on answering machine. 

## 2013-04-12 ENCOUNTER — Other Ambulatory Visit: Payer: Self-pay | Admitting: *Deleted

## 2013-04-12 DIAGNOSIS — I83893 Varicose veins of bilateral lower extremities with other complications: Secondary | ICD-10-CM

## 2013-04-12 DIAGNOSIS — M7989 Other specified soft tissue disorders: Secondary | ICD-10-CM

## 2013-05-10 ENCOUNTER — Encounter: Payer: Self-pay | Admitting: Vascular Surgery

## 2013-05-11 ENCOUNTER — Encounter: Payer: Self-pay | Admitting: Vascular Surgery

## 2013-05-11 ENCOUNTER — Encounter (INDEPENDENT_AMBULATORY_CARE_PROVIDER_SITE_OTHER): Payer: Self-pay

## 2013-05-11 ENCOUNTER — Ambulatory Visit (HOSPITAL_COMMUNITY)
Admission: RE | Admit: 2013-05-11 | Discharge: 2013-05-11 | Disposition: A | Discharge status: Home / Self Care | Payer: Medicare Other | Source: Ambulatory Visit | Attending: Vascular Surgery | Admitting: Vascular Surgery

## 2013-05-11 ENCOUNTER — Ambulatory Visit (INDEPENDENT_AMBULATORY_CARE_PROVIDER_SITE_OTHER): Payer: Medicare Other | Admitting: Vascular Surgery

## 2013-05-11 VITALS — BP 130/69 | HR 87 | Ht 61.0 in | Wt 166.4 lb

## 2013-05-11 DIAGNOSIS — I83893 Varicose veins of bilateral lower extremities with other complications: Secondary | ICD-10-CM

## 2013-05-11 DIAGNOSIS — M7989 Other specified soft tissue disorders: Secondary | ICD-10-CM

## 2013-05-11 NOTE — Progress Notes (Signed)
[facility] [place]  Referred by:  [name], MD [id number], [address] [place], [place] [address]  Reason for referral: Swollen B legs  History of Present Illness  [name] [name] is a 72 y.o. ([birth year]) female who presents with chief complaint: swollen B legs.  Patient notes, onset of swelling years ago, associated with OCP use.  The patient's symptoms include: swelling, heaviness, pruritus, and bursting sensation.  The patient has had no history of DVT, known history of pregnancy, known history of varicose vein, no history of venous stasis ulcers, no history of  Lymphedema and no history of skin changes in lower legs.  There is no family history of venous disorders.  The patient has used OTC compression stockings in the past.  Past Medical History  Diagnosis Date  . Arthritis   . Gastroesophageal reflux disease   . Vertigo     chronic with exacerbation  . Dizziness   . Esophageal stricture   . Hiatal hernia     Past Surgical History  Procedure Laterality Date  . Vaginal hysterectomy    . Bladder suspension      x 2    History   Social History  . Marital Status: Married    Spouse Name: N/A    Number of Children: 2  . Years of Education: N/A   Occupational History  . retired    Social History Main Topics  . Smoking status: Never Smoker   . Smokeless tobacco: Never Used  . Alcohol Use: No  . Drug Use: No  . Sexual Activity: Not on file   Other Topics Concern  . Not on file   Social History Narrative  . No narrative on file    Family History  Problem Relation Age of Onset  . Breast cancer Sister   . Kidney failure Father   . Diabetes Father   . Heart attack Sister     was also a smoker  . Colon cancer Other     niece     Current Outpatient Prescriptions on File Prior to Visit  Medication Sig Dispense Refill  . Cholecalciferol (VITAMIN D PO) Take by mouth daily.        Marland Kitchen CRANBERRY EXTRACT PO Take by mouth.        .  Multiple Vitamin (STRESS B PO) Take by mouth. Taking 3 daily       . omeprazole (PRILOSEC) 20 MG capsule Take 1 capsule (20 mg total) by mouth daily.  30 capsule  11  . tolterodine (DETROL LA) 4 MG 24 hr capsule Take 4 mg by mouth daily.       No current facility-administered medications on file prior to visit.    Allergies  Allergen Reactions  . Streptomycin     REVIEW OF SYSTEMS:  (Positives checked otherwise negative)  CARDIOVASCULAR:  []  chest pain, []  chest pressure, []  palpitations, []  shortness of breath when laying flat, []  shortness of breath with exertion,  []  pain in feet when walking, []  pain in feet when laying flat, []  history of blood clot in veins (DVT), [x]  history of phlebitis, [x]  swelling in legs, [x]  varicose veins  PULMONARY:  []  productive cough, []  asthma, []  wheezing  NEUROLOGIC:  []  weakness in arms or legs, []  numbness in arms or legs, []  difficulty speaking or slurred speech, []  temporary loss of vision in one eye, []  dizziness  HEMATOLOGIC:  []  bleeding problems, []  problems with blood clotting too easily  MUSCULOSKEL:  []  joint pain, []  joint swelling  GASTROINTEST:  []  vomiting blood, []  blood in stool     GENITOURINARY:  []  burning with urination, []  blood in urine  PSYCHIATRIC:  []  history of major depression  INTEGUMENTARY:  []  rashes, []  ulcers  CONSTITUTIONAL:  []  fever, []  chills  Physical Examination Filed Vitals:   05/11/13 1006  BP: 130/69  Pulse: 87  Height: 5\' 1"  (1.549 m)  Weight: 166 lb 6.4 oz (75.479 kg)  SpO2: 100%   Body mass index is 31.46 kg/(m^2).  General: A&O x 3, WD, obese  Head: [place]/AT  Ear/Nose/Throat: Hearing grossly intact, nares w/o erythema or drainage, oropharynx w/o Erythema/Exudate  Eyes: PERRLA, EOMI  Neck: Supple, no nuchal rigidity, no palpable LAD Pulmonary: Sym exp, good air movt, CTAB, no rales, rhonchi, & wheezing  Cardiac: RRR, Nl S1, S2, no Murmurs, rubs or gallops  Vascular: Vessel Right  Left  Radial Palpable Palpable  Brachial Palpable Palpable  Carotid Palpable, without bruit Palpable, without bruit  Aorta Not palpable N/A  Femoral Palpable Palpable  Popliteal Not palpable Not palpable  PT Palpable Palpable  DP Palpable Palpable   Gastrointestinal: soft, NTND, -G/R, - HSM, - masses, - CVAT B  Musculoskeletal: M/S 5/5 throughout , Extremities without ischemic changes , no LDS, B varicosities with extensive spider and reticular veins  Neurologic: CN 2-12 intact , Pain and light touch intact in extremities , Motor exam as listed above  Psychiatric: Judgment intact, Mood & affect appropriate for pt's clinical situation  Dermatologic: See M/S exam for extremity exam, no rashes otherwise noted  Lymph : No Cervical, Axillary, or Inguinal lymphadenopathy   Non-Invasive Vascular Imaging   BLE Venous Insufficiency Duplex (Date: 05/11/2013):   RLE: no DVT and SVT, + GSV reflux, + deep venous reflux  LLE: no DVT and SVT, + GSV reflux, + deep venous reflux  Medical Decision Making  [name] [name] is a 72 y.o. female who presents with: BLE chronic venous insufficiency (C2).   Based on the patient's history and examination, I recommend: compressive therapy.  I discussed with the patient the use of her 20-30 mm thigh high compression stockings and need for 3 month trial of such.  The patient will follow up in 3 months with my partners in the [facility] for evaluation for: EVLA B GSV.  Thank you for allowing [place] to participate in this patient's care.  Leonides Sake, MD [facility] [place] Office: [phone] Pager: [phone]  05/11/2013, 1:17 PM

## 2013-05-14 ENCOUNTER — Encounter: Payer: Self-pay | Admitting: *Deleted

## 2013-08-13 ENCOUNTER — Encounter: Payer: Self-pay | Admitting: Vascular Surgery

## 2013-08-14 ENCOUNTER — Encounter: Payer: Self-pay | Admitting: Vascular Surgery

## 2013-08-14 ENCOUNTER — Ambulatory Visit (INDEPENDENT_AMBULATORY_CARE_PROVIDER_SITE_OTHER): Payer: Medicare Other | Admitting: Vascular Surgery

## 2013-08-14 VITALS — BP 145/78 | HR 70 | Resp 18 | Ht 63.0 in | Wt 164.7 lb

## 2013-08-14 DIAGNOSIS — I83893 Varicose veins of bilateral lower extremities with other complications: Secondary | ICD-10-CM

## 2013-08-14 DIAGNOSIS — M7989 Other specified soft tissue disorders: Secondary | ICD-10-CM

## 2013-08-14 NOTE — Progress Notes (Signed)
Problems with Activities of Daily Living Secondary to Leg Pain  1. [name] states that she has great difficulty with all activities that require prolonged standing (cooking, cleaning, shopping) due to leg pain.    2. [name] states that she has difficulty with exercising due to leg pain.      Failure of  Conservative Therapy:  1. Worn 20-30 mm Hg thigh high compression hose >3 months with no relief of symptoms.  2. Frequently elevates legs-no relief of symptoms  3. Taken Ibuprofen 600 Mg TID with no relief of symptoms.  Here today for continued followup of bladder lower extremity venous hypertension. She has been extremely compliant with her compression garments and reports they do give her some slight relief in the aching sensation in her calf with prolonged standing. This is still a problem for her despite compression garment usage. Past Medical History  Diagnosis Date  . Arthritis   . Gastroesophageal reflux disease   . Vertigo     chronic with exacerbation  . Dizziness   . Esophageal stricture   . Hiatal hernia     History  Substance Use Topics  . Smoking status: Never Smoker   . Smokeless tobacco: Never Used  . Alcohol Use: No    Family History  Problem Relation Age of Onset  . Breast cancer Sister   . Kidney failure Father   . Diabetes Father   . Heart attack Sister     was also a smoker  . Colon cancer Other     niece    Allergies  Allergen Reactions  . Streptomycin     Current outpatient prescriptions:Cholecalciferol (VITAMIN D PO), Take by mouth daily.  , Disp: , Rfl: ;  ciprofloxacin (CIPRO) 500 MG tablet, , Disp: , Rfl: ;  CRANBERRY EXTRACT PO, Take by mouth.  , Disp: , Rfl: ;  Multiple Vitamin (STRESS B PO), Take by mouth. Taking 3 daily , Disp: , Rfl: ;  omeprazole (PRILOSEC) 20 MG capsule, Take 1 capsule (20 mg total) by mouth daily., Disp: 30 capsule, Rfl: 11 tolterodine (DETROL LA) 4 MG 24 hr capsule, Take 4 mg by mouth daily., Disp: , Rfl: ;   TRANSDERM-SCOP 1.5 MG, , Disp: , Rfl:   BP 145/78  Pulse 70  Resp 18  Ht 5\' 3"  (1.6 m)  Wt 164 lb 11.2 oz (74.707 kg)  BMI 29.18 kg/m2  Body mass index is 29.18 kg/(m^2).       Physical exam she does have significant telangiectasia bilaterally and some scattered varicosities. He has normal dorsalis pedis pulses bilaterally I reimaged her veins with SonoSite ultrasound this does show enlarged saphenous vein bilaterally with reflux.  Abdomen she has failed conservative treatment of her bilateral venous hypertension with a symptomatic disease. She has no significant deep venous reflux and therefore be an excellent candidate for ablation for symptom relief. I have recommended stat staged saphenous vein ablation. I describe this as an outpatient procedure in our o  with immediate  relation  following the procedure. He wishes to proceed as soon as possible. We will start with her right leg and then a staged left leg ablation

## 2013-08-22 ENCOUNTER — Other Ambulatory Visit: Payer: Self-pay | Admitting: *Deleted

## 2013-08-22 DIAGNOSIS — I83893 Varicose veins of bilateral lower extremities with other complications: Secondary | ICD-10-CM

## 2013-09-04 ENCOUNTER — Encounter: Payer: Self-pay | Admitting: Vascular Surgery

## 2013-09-05 ENCOUNTER — Encounter: Payer: Self-pay | Admitting: Vascular Surgery

## 2013-09-05 ENCOUNTER — Ambulatory Visit (INDEPENDENT_AMBULATORY_CARE_PROVIDER_SITE_OTHER): Payer: Medicare Other | Admitting: Vascular Surgery

## 2013-09-05 VITALS — BP 130/77 | HR 78 | Resp 16 | Ht 63.0 in | Wt 168.0 lb

## 2013-09-05 DIAGNOSIS — I83893 Varicose veins of bilateral lower extremities with other complications: Secondary | ICD-10-CM

## 2013-09-05 HISTORY — PX: ENDOVENOUS ABLATION SAPHENOUS VEIN W/ LASER: SUR449

## 2013-09-05 NOTE — Progress Notes (Signed)
   Laser Ablation Procedure      Date: 09/05/2013    [name] [name] DOB:1940/08/04  Consent signed: Yes  Surgeon:T.F. Furman Trentman  Procedure: Laser Ablation: right Greater Saphenous Vein  BP 130/77  Pulse 78  Resp 16  Ht 5\' 3"  (1.6 m)  Wt 168 lb (76.204 kg)  BMI 29.77 kg/m2  Start time: 12:45 PM   End time: 1:55 PM  Tumescent Anesthesia: 500 cc 0.9% NaCl with 50 cc Lidocaine HCL with 1% Epi and 15 cc 8.4% NaHCO3  Local Anesthesia: 4 cc Lidocaine HCL and NaHCO3 (ratio 2:1)  Continuous Mode: 15 Watts Total Energy 2542  Joules Total Time2:49       Patient tolerated procedure well: Yes    Description of Procedure:  After marking the course of the saphenous vein and the secondary varicosities in the standing position, the patient was placed on the operating table in the supine position, and the right leg was prepped and draped in sterile fashion. Local anesthetic was administered, and under ultrasound guidance the saphenous vein was accessed with a micro needle and guide wire; then the micro puncture sheath was placed. A guide wire was inserted to the saphenofemoral junction, followed by a 5 french sheath.  The position of the sheath and then the laser fiber below the junction was confirmed using the ultrasound and visualization of the aiming beam.  Tumescent anesthesia was administered along the course of the saphenous vein using ultrasound guidance. Protective laser glasses were placed on the patient, and the laser was fired at at 15 watt continuous mode.  For a total of 2542 joules.  A steri strip was applied to the puncture site.     ABD pads and thigh high compression stockings were applied.  Ace wrap bandages were applied over the phlebectomy sites and at the top of the saphenofemoral junction.  Blood loss was less than 15 cc.  The patient ambulated out of the operating room having tolerated the procedure well.

## 2013-09-06 ENCOUNTER — Encounter: Payer: Self-pay | Admitting: Vascular Surgery

## 2013-09-06 ENCOUNTER — Telehealth: Payer: Self-pay | Admitting: *Deleted

## 2013-09-06 NOTE — Telephone Encounter (Signed)
    09/06/2013  Time: 4:01 PM   Patient Name: [name] [name]  Patient of: T.F. Early  Procedure:Laser Ablation right greater saphenous vein 09-05-2013  Reached patient at home and checked  Her status  Yes    Comments/Actions Taken: [name] states that she is having no problems with leg pain or swelling.  Reviewed post procedural instructions with her and reminded her of post LA duplex and VV FU with Dr. Arbie CookeyEarly on 09-17-2013.      @SIGNATURE @

## 2013-09-14 ENCOUNTER — Encounter: Payer: Self-pay | Admitting: Vascular Surgery

## 2013-09-17 ENCOUNTER — Encounter: Payer: Self-pay | Admitting: Vascular Surgery

## 2013-09-17 ENCOUNTER — Ambulatory Visit (INDEPENDENT_AMBULATORY_CARE_PROVIDER_SITE_OTHER): Payer: Medicare Other | Admitting: Vascular Surgery

## 2013-09-17 ENCOUNTER — Ambulatory Visit (HOSPITAL_COMMUNITY)
Admission: RE | Admit: 2013-09-17 | Discharge: 2013-09-17 | Disposition: A | Discharge status: Home / Self Care | Payer: Medicare Other | Source: Ambulatory Visit | Attending: Vascular Surgery | Admitting: Vascular Surgery

## 2013-09-17 VITALS — BP 124/81 | HR 79 | Resp 16 | Ht 63.0 in | Wt 160.0 lb

## 2013-09-17 DIAGNOSIS — I83893 Varicose veins of bilateral lower extremities with other complications: Secondary | ICD-10-CM

## 2013-09-17 NOTE — Progress Notes (Signed)
.   A for followup of laser ablation of her right great saphenous vein on 09/05/2013. She had minimal bruising associated with this and very little discomfort. She has been compliant with her compression garments.  On physical exam she does have mild bruising and some thickening at the ablation site. She's had no skin reaction.  She underwent a venous duplex today and I reviewed this with the patient. This reveals closure of her great saphenous vein from the distal insertion site to approximately a 0.3 cm from the saphenofemoral junction. There is no evidence of DVT  Impression and plan: Successful ablation of her right great saphenous vein with very nice recovery. Similar treatment to her left leg in approximately 2-3 weeks

## 2013-10-17 ENCOUNTER — Encounter: Payer: Self-pay | Admitting: Vascular Surgery

## 2013-10-18 ENCOUNTER — Encounter: Payer: Self-pay | Admitting: Vascular Surgery

## 2013-10-18 ENCOUNTER — Ambulatory Visit (INDEPENDENT_AMBULATORY_CARE_PROVIDER_SITE_OTHER): Payer: Self-pay | Admitting: Vascular Surgery

## 2013-10-18 VITALS — BP 125/80 | HR 68 | Resp 18 | Ht 62.0 in | Wt 162.0 lb

## 2013-10-18 DIAGNOSIS — I83893 Varicose veins of bilateral lower extremities with other complications: Secondary | ICD-10-CM

## 2013-10-18 HISTORY — PX: ENDOVENOUS ABLATION SAPHENOUS VEIN W/ LASER: SUR449

## 2013-10-18 NOTE — Progress Notes (Signed)
   Laser Ablation Procedure      Date: 10/18/2013    [name] [name] DOB:Jun 25, 1940  Consent signed: Yes  Surgeon:T.F. Shayma Pfefferle  Procedure: Laser Ablation: left Greater Saphenous Vein  BP 125/80  Pulse 68  Resp 18  Ht 5\' 2"  (1.575 m)  Wt 162 lb (73.483 kg)  BMI 29.62 kg/m2  Start time: 8:45am   End time: 9:40am  Tumescent Anesthesia: 500 cc 0.9% NaCl with 50 cc Lidocaine HCL with 1% Epi and 15 cc 8.4% NaHCO3  Local Anesthesia: 3 cc Lidocaine HCL and NaHCO3 (ratio 2:1)  Continuous Mode: 15 Watts Total Energy 2252 Joules Total Time2:30       Patient tolerated procedure well: Yes    Description of Procedure:  After marking the course of the saphenous vein and the secondary varicosities in the standing position, the patient was placed on the operating table in the supine position, and the left leg was prepped and draped in sterile fashion. Local anesthetic was administered, and under ultrasound guidance the saphenous vein was accessed with a micro needle and guide wire; then the micro puncture sheath was placed. A guide wire was inserted to the saphenofemoral junction, followed by a 5 french sheath.  The position of the sheath and then the laser fiber below the junction was confirmed using the ultrasound and visualization of the aiming beam.  Tumescent anesthesia was administered along the course of the saphenous vein using ultrasound guidance. Protective laser glasses were placed on the patient, and the laser was fired at at 15 watt continuous mode.  For a total of 2252 joules.  A steri strip was applied to the puncture site.     ABD pads and thigh high compression stockings were applied.  Ace wrap bandages were applied  at the top of the saphenofemoral junction.  Blood loss was less than 15 cc.  The patient ambulated out of the operating room having tolerated the procedure well.

## 2013-10-23 ENCOUNTER — Telehealth: Payer: Self-pay | Admitting: *Deleted

## 2013-10-23 ENCOUNTER — Encounter: Payer: Self-pay | Admitting: Vascular Surgery

## 2013-10-23 NOTE — Telephone Encounter (Signed)
    10/23/2013  Time: 9:13 AM   Patient Name: [name] [name]  Patient of: T.F. Early  Procedure:Laser Ablation left greater saphenous vein 10-18-2013  Reached patient at home and checked  Her status  Yes    Comments/Actions Taken: Mrs. Banaszak states no problems with leg pain or swelling.  Reviewed post procedural instructions with her and reminded her of post laser ablation duplex and VV follow up with Dr. Arbie Cookey on 10-25-2013.      @SIGNATURE @

## 2013-10-24 ENCOUNTER — Encounter: Payer: Self-pay | Admitting: Vascular Surgery

## 2013-10-25 ENCOUNTER — Ambulatory Visit (INDEPENDENT_AMBULATORY_CARE_PROVIDER_SITE_OTHER): Payer: Medicare Other | Admitting: Vascular Surgery

## 2013-10-25 ENCOUNTER — Encounter: Payer: Self-pay | Admitting: Vascular Surgery

## 2013-10-25 ENCOUNTER — Ambulatory Visit (HOSPITAL_COMMUNITY)
Admission: RE | Admit: 2013-10-25 | Discharge: 2013-10-25 | Disposition: A | Discharge status: Home / Self Care | Payer: Medicare Other | Source: Ambulatory Visit | Attending: Vascular Surgery | Admitting: Vascular Surgery

## 2013-10-25 VITALS — BP 122/55 | HR 72 | Resp 16 | Ht 62.0 in | Wt 165.0 lb

## 2013-10-25 DIAGNOSIS — I83893 Varicose veins of bilateral lower extremities with other complications: Secondary | ICD-10-CM

## 2013-10-25 NOTE — Progress Notes (Signed)
The patient presents today for one week followup of her left leg laser ablation of great saphenous vein. She had had similar treatment several weeks earlier to her right leg with excellent result. She does have a usual amount of mild soreness and has been compliant with her compression garments.  On physical exam she does have 2+ left dorsalis pedis pulse. She does have mild bradycardia bruising over the medial thigh ablation site. She has minimal soreness.  Venous duplex today was reviewed with the patient. This shows closure of her great saphenous vein the left from the distal insertion site below her knee up to the saphenofemoral junction with no evidence of DVT  Impression and plan successful ablation of great saphenous vein bilaterally. The patient will continue to wear compression garment left leg for one additional week and then on an as-needed basis. She's quite pleased with the result will see [place] again on an as-needed basis

## 2013-12-13 NOTE — Telephone Encounter (Signed)
Close Encounter 

## 2014-10-31 ENCOUNTER — Encounter: Payer: Self-pay | Admitting: Gastroenterology

## 2015-11-12 ENCOUNTER — Telehealth: Payer: Self-pay | Admitting: Gastroenterology

## 2015-11-12 NOTE — Telephone Encounter (Signed)
According to records scanned into the chart last colonoscopy from 2013 performed by Dr. Elnoria HowardHung she had only hemorrhoids and diverticulosis. Per report there was no recall recommended.  Dr. Russella DarStark will you please review and advise (scanned under media and not under procedures) if she needs a recall colon?  Patient advised that Dr. Russella DarStark is out of the office and we will notify her once he has a chance to review.

## 2015-11-14 NOTE — Telephone Encounter (Signed)
As per Dr Haywood PaoHung's colonoscopy report from 2013-she will be over age 75 when she is due for next routine screening colonoscopy so it is not recommended. If she has LGI signs/symptoms we can see her in the office to assess.

## 2015-11-14 NOTE — Telephone Encounter (Signed)
Left message on machine to call back  

## 2015-11-17 ENCOUNTER — Ambulatory Visit (INDEPENDENT_AMBULATORY_CARE_PROVIDER_SITE_OTHER): Payer: Medicare Other | Admitting: Gastroenterology

## 2015-11-17 ENCOUNTER — Encounter: Payer: Self-pay | Admitting: Gastroenterology

## 2015-11-17 VITALS — BP 102/68 | HR 72 | Ht 62.0 in | Wt 153.0 lb

## 2015-11-17 DIAGNOSIS — R1314 Dysphagia, pharyngoesophageal phase: Secondary | ICD-10-CM

## 2015-11-17 DIAGNOSIS — K219 Gastro-esophageal reflux disease without esophagitis: Secondary | ICD-10-CM

## 2015-11-17 MED ORDER — OMEPRAZOLE 20 MG PO CPDR: 20.0000 mg | DELAYED_RELEASE_CAPSULE | Freq: Every day | ORAL | Status: DC

## 2015-11-17 NOTE — Patient Instructions (Signed)
Resume your omeprazole 20 mg daily. A new prescription has been sent to your pharmacy.   You have been scheduled for an endoscopy. Please follow written instructions given to you at your visit today. If you use inhalers (even only as needed), please bring them with you on the day of your procedure. Your physician has requested that you go to www.startemmi.com and enter the access code given to you at your visit today. This web site gives a general overview about your procedure. However, you should still follow specific instructions given to you by our office regarding your preparation for the procedure.  Thank you for choosing me and [facility].  Venita LickMalcolm T. Pleas KochStark, Jr., MD., Clementeen GrahamFACG

## 2015-11-17 NOTE — Progress Notes (Signed)
History of Present Illness: This is a 75 year old female referred by [name] [name], William, MD for the evaluation of dysphagia. She has a history of an esophageal stricture, hiatal hernia and Cameron erosions. She last underwent EGD with dilation in March 2014. He discontinued taking daily PPI over one year ago. She relates no reflux symptoms. She has had worsening problems with solid food dysphagia and recently had an episode or piece of pork chop was stopped for over an hour she states her husband perform the Heimlich maneuver twice on her this was not effective and she eventually vomited the piece of meat and her dysphagia resolved. She has been avoiding all meats since then.  Allergies  Allergen Reactions  . Streptomycin     Past Medical History  Diagnosis Date  . Arthritis   . Gastroesophageal reflux disease   . Vertigo     chronic with exacerbation  . Dizziness   . Esophageal stricture   . Hiatal hernia   . Varicose veins    Past Surgical History  Procedure Laterality Date  . Vaginal hysterectomy    . Bladder suspension      x 2  . Endovenous ablation saphenous vein w/ laser Right 09-05-2013    right greater saphenous vein by Gretta Beganodd Early MD  . Endovenous ablation saphenous vein w/ laser Left 10-18-2013    endovenous laser ablation left greater saphenous vein by Gretta Beganodd Early MD   Social History   Social History  . Marital Status: Married    Spouse Name: N/A  . Number of Children: 2  . Years of Education: N/A   Occupational History  . retired    Social History Main Topics  . Smoking status: Never Smoker   . Smokeless tobacco: Never Used  . Alcohol Use: No  . Drug Use: No  . Sexual Activity: Not Asked   Other Topics Concern  . None   Social History Narrative   Family History  Problem Relation Age of Onset  . Breast cancer Sister   . Kidney failure Father   . Diabetes Father   . Heart attack Sister     was also a smoker  . Colon cancer Other     niece       Review of Systems: Pertinent positive and negative review of systems were noted in the above HPI section. All other review of systems were otherwise negative.   Physical Exam: General: Well developed, well nourished, no acute distress Head: Normocephalic and atraumatic Eyes:  sclerae anicteric, EOMI Ears: Normal auditory acuity Mouth: No deformity or lesions Neck: Supple, no masses or thyromegaly Lungs: Clear throughout to auscultation Heart: Regular rate and rhythm; no murmurs, rubs or bruits Abdomen: Soft, non tender and non distended. No masses, hepatosplenomegaly or hernias noted. Normal Bowel sounds Musculoskeletal: Symmetrical with no gross deformities  Skin: No lesions on visible extremities Pulses:  Normal pulses noted Extremities: No clubbing, cyanosis, edema or deformities noted Neurological: Alert oriented x 4, grossly nonfocal Cervical Nodes:  No significant cervical adenopathy Inguinal Nodes: No significant inguinal adenopathy Psychological:  Alert and cooperative. Normal mood and affect  Assessment and Recommendations:  1. Dysphagia, GERD, history of an esophageal stricture. Suspected recurrent stricture. Resume omeprazole 20 mg daily indefinitely. Follow all standard antireflux measures. Soft foods only until dilation completed. Schedule EGD with possible dilation. The risks (including bleeding, perforation, infection, missed lesions, medication reactions and possible hospitalization or surgery if complications occur), benefits, and alternatives to endoscopy with possible  biopsy and possible dilation were discussed with the patient and they consent to proceed.    cc: [name] Blamer, MD [phone] [address] [place], [place] [address]

## 2015-11-17 NOTE — Telephone Encounter (Signed)
Left message on machine to call back  

## 2015-11-18 NOTE — Telephone Encounter (Signed)
Left message on machine to call back letter mailed to the pt  

## 2015-11-24 ENCOUNTER — Encounter: Payer: Self-pay | Admitting: Gastroenterology

## 2015-11-24 ENCOUNTER — Ambulatory Visit (AMBULATORY_SURGERY_CENTER): Payer: Medicare Other | Admitting: Gastroenterology

## 2015-11-24 ENCOUNTER — Encounter: Payer: Self-pay | Admitting: *Deleted

## 2015-11-24 VITALS — BP 101/57 | HR 65 | Temp 99.1°F | Resp 22 | Ht 62.0 in | Wt 153.0 lb

## 2015-11-24 DIAGNOSIS — R1310 Dysphagia, unspecified: Secondary | ICD-10-CM

## 2015-11-24 DIAGNOSIS — K222 Esophageal obstruction: Secondary | ICD-10-CM | POA: Diagnosis not present

## 2015-11-24 DIAGNOSIS — R1314 Dysphagia, pharyngoesophageal phase: Secondary | ICD-10-CM | POA: Diagnosis not present

## 2015-11-24 DIAGNOSIS — R1319 Other dysphagia: Secondary | ICD-10-CM

## 2015-11-24 MED ORDER — SODIUM CHLORIDE 0.9 % IV SOLN: 500.0000 mL | INTRAVENOUS | Status: DC

## 2015-11-24 NOTE — Progress Notes (Signed)
Called to room to assist during endoscopic procedure.  Patient ID and intended procedure confirmed with present staff. Received instructions for my participation in the procedure from the performing physician.  

## 2015-11-24 NOTE — Op Note (Signed)
[facility] Patient Name: [name] [name] Procedure Date: 11/24/2015 9:22 AM MRN: [id number] Endoscopist: Meryl Dare , MD Age: 75 Referring MD:  Date of Birth: [birth year] Gender: Female Account #: [id number] Procedure:                Upper GI endoscopy Indications:              Dysphagia Medicines:                Monitored Anesthesia Care Procedure:                Pre-Anesthesia Assessment:                           - Prior to the procedure, a History and Physical                            was performed, and patient medications and                            allergies were reviewed. The patient's tolerance of                            previous anesthesia was also reviewed. The risks                            and benefits of the procedure and the sedation                            options and risks were discussed with the patient.                            All questions were answered, and informed consent                            was obtained. Prior Anticoagulants: The patient has                            taken no previous anticoagulant or antiplatelet                            agents. ASA Grade Assessment: II - A patient with                            mild systemic disease. After reviewing the risks                            and benefits, the patient was deemed in                            satisfactory condition to undergo the procedure.                           After obtaining informed consent, the endoscope was  passed under direct vision. Throughout the                            procedure, the patient's blood pressure, pulse, and                            oxygen saturations were monitored continuously. The                            Model GIF-HQ190 [id number]) scope was introduced                            through the mouth, and advanced to the second part                            of duodenum. The upper GI endoscopy was                        accomplished without difficulty. The patient                            tolerated the procedure well. Scope In: Scope Out: Findings:                 One moderate benign-appearing, intrinsic stenosis                            was found at the gastroesophageal junction. This                            measured 1.1 cm (inner diameter) and was traversed.                            A guidewire was placed and the scope was withdrawn.                            Dilation was performed with a Savary dilator with                            mild resistance at 12 mm. Estimated blood loss:                            none. Dilation was performed with a Savary dilator                            with mild resistance at 13 mm. Estimated blood                            loss: none. Dilation was performed with a Savary                            dilator with no resistance at 14 mm. Estimated  blood loss: none.                           The exam of the esophagus was otherwise normal.                           A small hiatal hernia was present.                           The exam of the stomach was otherwise normal.                           The duodenal bulb and second portion of the                            duodenum were normal. Complications:            No immediate complications. Estimated Blood Loss:     Estimated blood loss: none. Impression:               - Benign-appearing esophageal stenosis. Dilated.                           - Small hiatal hernia.                           - Normal duodenal bulb and second portion of the                            duodenum.                           - No specimens collected. Recommendation:           - Patient has a contact number available for                            emergencies. The signs and symptoms of potential                            delayed complications were discussed with the                             patient. Return to normal activities tomorrow.                            Written discharge instructions were provided to the                            patient.                           - Post dilation instructions                           - Antireflux measures                           -  Clear liquid diet for 2 hours then soft diet for                            rest of today and tomorrow advance as tolerated.                           - Continue present medications.                           - Return to my office in 1 month. Meryl DareMalcolm T Shloimy Michalski, MD 11/24/2015 9:44:51 AM This report has been signed electronically.

## 2015-11-24 NOTE — Patient Instructions (Signed)
YOU HAD AN ENDOSCOPIC PROCEDURE TODAY AT THE [facility]:   Refer to the procedure report that was given to you for any specific questions about what was found during the examination.  If the procedure report does not answer your questions, please call your gastroenterologist to clarify.  If you requested that your care partner not be given the details of your procedure findings, then the procedure report has been included in a sealed envelope for you to review at your convenience later.  YOU SHOULD EXPECT: Some feelings of bloating in the abdomen. Passage of more gas than usual.  Walking can help get rid of the air that was put into your GI tract during the procedure and reduce the bloating. If you had a lower endoscopy (such as a colonoscopy or flexible sigmoidoscopy) you may notice spotting of blood in your stool or on the toilet paper. If you underwent a bowel prep for your procedure, you may not have a normal bowel movement for a few days.  Please Note:  You might notice some irritation and congestion in your nose or some drainage.  This is from the oxygen used during your procedure.  There is no need for concern and it should clear up in a day or so.  SYMPTOMS TO REPORT IMMEDIATELY:   Following upper endoscopy (EGD)  Vomiting of blood or coffee ground material  New chest pain or pain under the shoulder blades  Painful or persistently difficult swallowing  New shortness of breath  Fever of 100F or higher  Black, tarry-looking stools  For urgent or emergent issues, a gastroenterologist can be reached at any hour by calling [phone].   DIET: Clear liquids until noon today, and then you may have a soft diet for the rest of today.  Tomorrow, you may have a regular diet.  Drink plenty of fluids but you should avoid alcoholic beverages for 24 hours.  ACTIVITY:  You should plan to take it easy for the rest of today and you should NOT DRIVE or use heavy machinery until tomorrow  (because of the sedation medicines used during the test).    FOLLOW UP: Our staff will call the number listed on your records the next business day following your procedure to check on you and address any questions or concerns that you may have regarding the information given to you following your procedure. If we do not reach you, we will leave a message.  However, if you are feeling well and you are not experiencing any problems, there is no need to return our call.  We will assume that you have returned to your regular daily activities without incident.  If any biopsies were taken you will be contacted by phone or by letter within the next 1-3 weeks.  Please call us at [phone] if you have not heard about the biopsies in 3 weeks.    SIGNATURES/CONFIDENTIALITY: You and/or your care partner have signed paperwork which will be entered into your electronic medical record.  These signatures attest to the fact that that the information above on your After Visit Summary has been reviewed and is understood.  Full responsibility of the confidentiality of this discharge information lies with you and/or your care-partner.  Try to follow the GERD diet as much as possible.  We will see you in the office in one month.  Read all of the handouts given to you by your recovery room nurse.

## 2015-11-24 NOTE — Progress Notes (Signed)
A/ox3, pleased with MAC, report to RN 

## 2015-11-25 ENCOUNTER — Telehealth: Payer: Self-pay

## 2015-11-25 NOTE — Telephone Encounter (Signed)
Left message on answering machine. 

## 2016-01-05 ENCOUNTER — Ambulatory Visit (INDEPENDENT_AMBULATORY_CARE_PROVIDER_SITE_OTHER): Payer: Medicare Other | Admitting: Neurology

## 2016-01-05 ENCOUNTER — Encounter: Payer: Self-pay | Admitting: Neurology

## 2016-01-05 VITALS — BP 129/83 | HR 92 | Resp 16 | Ht 62.0 in | Wt 152.0 lb

## 2016-01-05 DIAGNOSIS — R42 Dizziness and giddiness: Secondary | ICD-10-CM | POA: Diagnosis not present

## 2016-01-05 DIAGNOSIS — R413 Other amnesia: Secondary | ICD-10-CM | POA: Diagnosis not present

## 2016-01-05 NOTE — Progress Notes (Signed)
Subjective:    Patient ID: [name] [name] is a 75 y.o. female.  HPI     Huston Foley, MD, PhD [facility] [address], [address] [address] [place], [place] [address]  Dear Dr. Marisa Sprinkles,   I saw your patient, [name] [name], upon your kind request in my neurologic clinic today for initial consultation of her memory loss, associated with change in behavior, confusion, and dizziness. The patient is accompanied by her husband today. As you know, Ms. Guilbert is a 75 year old right-handed woman with an underlying medical history of hiatal hernia, reflux disease, esophageal stricture, long-standing history of dizziness and vertigo, arthritis, who reports intermittent dizziness for the past 4-5 months. She reports that this is different from her vertigo feeling. For vertigo she had workup in the past at [facility] and also had trouble with the vertigo test per husband. Patient reportedly has had some memory issues for the past few years according to your records including some changes in personality or behavior as voiced by her husband and niece. Husband says that there is no niece involved in her care. He denies that there have been any major personality changes or behavioral issues. He does admit that they tend to argue a little bit more as they grow olde together. He feels that her memory loss is probably in the realm of acceptable for age. He does not provide any other details. Patient denies that she has any significant memory issues. She drives. She reports no issues driving. She has no family history of Alzheimer's dementia or other type of memory loss. She is the youngest of 10 siblings. She has 1 sister left and one brother left, without any major medical or neurological problems as I understand. The patient has been retired. She used to work in an office. She has a high school education. They have 1 daughter in [place] and one son who lives in [place]. They have 2  grandchildren. She is a nonsmoker, does not drink alcohol, drinks coffee as well as tea. Maybe 2 bottles of water per day. She does not sleep very well. She has not been a good sleeper for years per husband.  She had recent vestibular evaluation at [facility]. She had several abnormalities, but Dix-Hallpike was negative, square were jerk nystagmus during gaze with fixation, abnormal gain during tracking, abnormal peak loss of the, latency and accuracy during saccades, abnormally slow but symmetrical optic kinetic testing, overall grossly abnormal VNG. During caloric testing she became sick and started vomiting unable to complete the test.  Her Past Medical History Is Significant For: Past Medical History:  Diagnosis Date  . Arthritis   . Dizziness   . Esophageal stricture   . Gastroesophageal reflux disease   . Hiatal hernia   . Hiatal hernia   . Varicose veins   . Vertigo    chronic with exacerbation    Her Past Surgical History Is Significant For: Past Surgical History:  Procedure Laterality Date  . BLADDER SUSPENSION     x 2  . ENDOVENOUS ABLATION SAPHENOUS VEIN W/ LASER Right 09-05-2013   right greater saphenous vein by Gretta Began MD  . ENDOVENOUS ABLATION SAPHENOUS VEIN W/ LASER Left 10-18-2013   endovenous laser ablation left greater saphenous vein by Gretta Began MD  . VAGINAL HYSTERECTOMY      Her Family History Is Significant For: Family History  Problem Relation Age of Onset  . Breast cancer Sister   .  Kidney failure Father   . Diabetes Father   . Heart attack Sister     was also a smoker  . Colon cancer Other     niece    Her Social History Is Significant For: Social History   Social History  . Marital status: Married    Spouse name: [name] [name]  . Number of children: 2  . Years of education: HS   Occupational History  . retired    Social History Main Topics  . Smoking status: Never Smoker  . Smokeless tobacco: Never Used  . Alcohol use No   . Drug use: No  . Sexual activity: Not on file   Other Topics Concern  . Not on file   Social History Narrative   Drinks 1 cup of coffee a day     Her Allergies Are:  Allergies  Allergen Reactions  . Streptomycin   :   Her Current Medications Are:  Outpatient Encounter Prescriptions as of 01/05/2016  Medication Sig  . CRANBERRY EXTRACT PO Take by mouth.    . Multiple Vitamin (MULTIVITAMIN) tablet Take 1 tablet by mouth daily.  Marland Kitchen omeprazole (PRILOSEC) 20 MG capsule Take 1 capsule (20 mg total) by mouth daily.  . TRANSDERM-SCOP 1.5 MG    No facility-administered encounter medications on file as of 01/05/2016.   :   Review of Systems:  Out of a complete 14 point review of systems, all are reviewed and negative with the exception of these symptoms as listed below:  Review of Systems  Neurological:       Patient reports that she has been having some dizziness for past 3-4 months.  Husband states that she has had dizziness for years.  Patient denies any memory loss or confusion.     Objective:  Neurologic Exam  Physical Exam Physical Examination:   Vitals:   01/05/16 1326 01/05/16 1330  BP: 134/85 129/83  Pulse: 80 92  Resp: 16     General Examination: The patient is a very pleasant 75 y.o. female in no acute distress. She appears well-developed and well-nourished and well groomed. No orthostatic dizziness reported, no orthostatic drop in blood pressure values or change in pulse to a significant degree, physiologic increase in pulse noted. No vertigo reported, no problems changing position quickly.  HEENT: Normocephalic, atraumatic, pupils are equal, round and reactive to light and accommodation. Funduscopic exam is normal with sharp disc margins noted. Extraocular tracking is good without limitation to gaze excursion or nystagmus noted. Normal smooth pursuit is noted. Hearing is grossly intact. Face is symmetric with normal facial animation and normal facial sensation.  Speech is clear with no dysarthria noted. There is no hypophonia. There is no lip, neck/head, jaw or voice tremor. Neck is supple with full range of passive and active motion. There are no carotid bruits on auscultation. Oropharynx exam reveals: moderate mouth dryness, adequate dental hygiene and mild airway crowding. Mallampati is class II. Tongue protrudes centrally and palate elevates symmetrically.   Chest: Clear to auscultation without wheezing, rhonchi or crackles noted.  Heart: S1+S2+0, regular and normal without murmurs, rubs or gallops noted.   Abdomen: Soft, non-tender and non-distended with normal bowel sounds appreciated on auscultation.  Extremities: There is no pitting edema in the distal lower extremities bilaterally. Pedal pulses are intact.  Skin: Warm and dry without trophic changes noted. There are no varicose veins.  Musculoskeletal: exam reveals no obvious joint deformities, tenderness or joint swelling or erythema.   Neurologically:  Mental  status: The patient is awake, alert and oriented in all 4 spheres. Her immediate and remote memory, attention, language skills and fund of knowledge are mildly impaired. She's not able to provide a detailed history. There is no evidence of aphasia, agnosia, apraxia or anomia. Speech is clear with normal prosody and enunciation. Thought process is linear. Mood is constricted and affect is blunted.   On 01/05/2016: MMSE: 24/30, she missed 1 on date and one on month, one for Dr., she missed 2 points for spelling world backwards, CDT: 3/4, animal fluency was 12/m. Cranial nerves II - XII are as described above under HEENT exam. In addition: shoulder shrug is normal with equal shoulder height noted. Motor exam: Normal bulk, strength and tone is noted. There is no drift, tremor or rebound. Romberg is negative. Reflexes are 1+ throughout. Babinski: Toes are flexor bilaterally. Fine motor skills and coordination: intact with normal finger taps,  normal hand movements, normal rapid alternating patting, normal foot taps and normal foot agility.  Cerebellar testing: No dysmetria or intention tremor on finger to nose testing. Heel to shin is unremarkable bilaterally. There is no truncal or gait ataxia.  Sensory exam: intact to light touch, pinprick, vibration, temperature sense in the upper and lower extremities.  Gait, station and balance: She stands easily. No veering to one side is noted. No leaning to one side is noted. Posture is age-appropriate and stance is narrow based. Gait shows normal stride length and normal pace. No problems turning are noted. She has preserved arm swing when walking.               Assessment and Plan:    In summary, Jamse Arnnita L Ansell is a very pleasant 75 y.o.-year old female with an underlying medical history of hiatal hernia, reflux disease, esophageal stricture, long-standing history of dizziness and vertigo, arthritis, who reports intermittent dizziness for the past 4-5 months. She was also referred for memory loss and concern for changes in her behavior and personality in the recent past but this has either improved or the patient and her husband are trying to downplay this issue today. Nevertheless, her memory scores are not full, dizziness is nonspecific, she has a fairly non-focal neurological exam today which is reassuring. Nevertheless, I suggested we proceed with an MRI brain with and without contrast. She has routine blood work through her primary care physician at least once a year. I advised them to make sure her thyroid function and B12 level were fine. I don't see a primary neurological reason for dizziness in her case, she certainly does not describe vertigo or cerebellar ataxia, has a fairly well preserved fine motor skills exam, no evidence of parkinsonism, no history suggestive of seizures. For now, I would like to proceed with a brain MRI. We can certainly consider a full neuropsychological evaluation in the  near future. We will continue to monitor. I will see her back routinely after a couple months, sooner as needed. I suggested no new medications today. I did suggest that she increase her water intake to about 3 bottles per day if possible. We also talked about maintaining a healthy lifestyle in general. I asked her to continue to stay active mentally and physically. I answered all their questions today and the patient and her husband were in agreement. Thank you very much for allowing me to participate in the care of this nice patient. If I can be of any further assistance to you please do not hesitate to call me  at [phone].  Sincerely,   Star Age, MD, PhD

## 2016-01-05 NOTE — Patient Instructions (Signed)
Please drink about 3 bottles of water daily. (16.9 oz bottle).   Your exam does not show any specific neurological deficit.   You have some memory loss. Please make sure, Dr. Tiburcio PeaHarris about your thyroid function and vitamin B12 level.   We will do a brain scan, called MRI and call you with the test results. We will have to schedule you for this on a separate date. This test requires authorization from your insurance, and we will take care of the insurance process.  We will monitor your exam.   Change positions slowly.

## 2016-01-15 ENCOUNTER — Encounter: Payer: Self-pay | Admitting: Gastroenterology

## 2016-01-15 ENCOUNTER — Ambulatory Visit (INDEPENDENT_AMBULATORY_CARE_PROVIDER_SITE_OTHER): Payer: BC Managed Care – PPO | Admitting: Gastroenterology

## 2016-01-15 VITALS — BP 108/62 | HR 74 | Ht 62.0 in | Wt 153.4 lb

## 2016-01-15 DIAGNOSIS — K222 Esophageal obstruction: Secondary | ICD-10-CM

## 2016-01-15 DIAGNOSIS — R1319 Other dysphagia: Secondary | ICD-10-CM

## 2016-01-15 DIAGNOSIS — R1314 Dysphagia, pharyngoesophageal phase: Secondary | ICD-10-CM | POA: Diagnosis not present

## 2016-01-15 DIAGNOSIS — R1310 Dysphagia, unspecified: Secondary | ICD-10-CM

## 2016-01-15 NOTE — Patient Instructions (Addendum)
Stay on omeprazole daily.   Thank you for choosing me and [facility].  Malcolm T. Stark, Jr., MD., FACG  

## 2016-01-15 NOTE — Progress Notes (Signed)
    History of Present Illness: This is a 75 year old female returning for follow-up after EGD with dilation of esophageal stricture. See findings below. Her dysphagia has resolved.  EGD impression 11/24/2015: - Benign-appearing esophageal stenosis. Dilated by Savary to 14 mm. - Small hiatal hernia. - Normal duodenal bulb and second portion of the duodenum.  Current Medications, Allergies, Past Medical History, Past Surgical History, Family History and Social History were reviewed in [facility] electronic medical record.  Physical Exam: General: Well developed, well nourished, no acute distress Head: Normocephalic and atraumatic Eyes:  sclerae anicteric, EOMI Ears: Normal auditory acuity Mouth: No deformity or lesions Lungs: Clear throughout to auscultation Heart: Regular rate and rhythm; no murmurs, rubs or bruits Abdomen: Soft, non tender and non distended. No masses, hepatosplenomegaly or hernias noted. Normal Bowel sounds Musculoskeletal: Symmetrical with no gross deformities  Pulses:  Normal pulses noted Extremities: No clubbing, cyanosis, edema or deformities noted Neurological: Alert oriented x 4, grossly nonfocal Psychological:  Alert and cooperative. Normal mood and affect  Assessment and Recommendations:  1. Dysphagia improved post dilation of esophageal stricture. Continue standard antireflux measures and omeprazole 20 mg by mouth every morning long-term.  Patient's advised to call if dysphagia returns. Return office visit in one year.

## 2016-02-03 ENCOUNTER — Telehealth: Payer: Self-pay | Admitting: Neurology

## 2016-02-03 NOTE — Telephone Encounter (Signed)
Pt's husband called sts the pt is ready to have MRI. Any day is fine except Wednesday. She has not had MRI before but doesn't think she is catastrophic.

## 2016-02-06 NOTE — Telephone Encounter (Signed)
Called and spoke with patients husband to let him know that he would need to call [facility] to schedule.

## 2016-02-16 ENCOUNTER — Inpatient Hospital Stay: Admission: RE | Admit: 2016-02-16 | Payer: BC Managed Care – PPO | Source: Ambulatory Visit

## 2016-03-10 ENCOUNTER — Ambulatory Visit
Admission: RE | Admit: 2016-03-10 | Discharge: 2016-03-10 | Disposition: A | Discharge status: Home / Self Care | Payer: Medicare Other | Source: Ambulatory Visit | Attending: Neurology | Admitting: Neurology

## 2016-03-10 DIAGNOSIS — R413 Other amnesia: Secondary | ICD-10-CM

## 2016-03-10 DIAGNOSIS — R42 Dizziness and giddiness: Secondary | ICD-10-CM

## 2016-03-11 NOTE — Progress Notes (Signed)
Please call patient regarding the recent brain MRI: The brain scan showed a normal structure of the brain and mild volume loss which we call atrophy. There were changes in the deeper structures of the brain, which we call white matter changes or microvascular changes. These were reported as mild in Her case. These are tiny white spots, that occur with time and are seen in a variety of conditions, including with normal aging, chronic hypertension, chronic headaches, especially migraine HAs, chronic diabetes, chronic hyperlipidemia. These are not strokes and no mass or lesion or contrast enhancement was seen which is reassuring. Again, there were no acute findings, such as a stroke, or mass or blood products. No further action is required on this test at this time, other than re-enforcing the importance of good blood pressure control, good cholesterol control, good blood sugar control, and weight management. Please remind patient to keep any upcoming appointments or tests and to call us with any interim questions, concerns, problems or updates. Thanks,  Evona Westra, MD, PhD   

## 2016-03-16 ENCOUNTER — Telehealth: Payer: Self-pay

## 2016-03-16 NOTE — Telephone Encounter (Signed)
Patient called back and I gave results of MRI. She voiced understanding.

## 2016-03-16 NOTE — Telephone Encounter (Signed)
Lm for patient to call back for results.  

## 2016-03-16 NOTE — Telephone Encounter (Signed)
-----   Message from Huston Foley, MD sent at 03/11/2016  4:42 PM EDT ----- Please call patient regarding the recent brain MRI: The brain scan showed a normal structure of the brain and mild volume loss which we call atrophy. There were changes in the deeper structures of the brain, which we call white matter changes or microvascular changes. These were reported as mild in Her case. These are tiny white spots, that occur with time and are seen in a variety of conditions, including with normal aging, chronic hypertension, chronic headaches, especially migraine HAs, chronic diabetes, chronic hyperlipidemia. These are not strokes and no mass or lesion or contrast enhancement was seen which is reassuring. Again, there were no acute findings, such as a stroke, or mass or blood products. No further action is required on this test at this time, other than re-enforcing the importance of good blood pressure control, good cholesterol control, good blood sugar control, and weight management. Please remind patient to keep any upcoming appointments or tests and to call us with any interim questions, concerns, problems or updates. Thanks,  Huston Foley, MD, PhD

## 2016-04-20 ENCOUNTER — Ambulatory Visit: Payer: BC Managed Care – PPO | Admitting: Neurology

## 2016-10-31 ENCOUNTER — Other Ambulatory Visit: Payer: Self-pay | Admitting: Gastroenterology

## 2016-12-30 ENCOUNTER — Other Ambulatory Visit: Payer: Self-pay | Admitting: Gastroenterology

## 2017-02-03 ENCOUNTER — Other Ambulatory Visit: Payer: Self-pay | Admitting: Gastroenterology

## 2017-03-05 ENCOUNTER — Other Ambulatory Visit: Payer: Self-pay | Admitting: Gastroenterology

## 2017-04-10 ENCOUNTER — Other Ambulatory Visit: Payer: Self-pay | Admitting: Gastroenterology

## 2019-07-10 ENCOUNTER — Ambulatory Visit: Payer: Medicare PPO | Attending: Internal Medicine

## 2019-07-10 DIAGNOSIS — Z23 Encounter for immunization: Secondary | ICD-10-CM

## 2019-07-10 NOTE — Progress Notes (Signed)
   Covid-19 Vaccination Clinic  Name:  [name] [name]    MRN: [id number] DOB: 1940-07-31  07/10/2019  [name] [name] was observed post Covid-19 immunization for 15 minutes without incidence. She was provided with Vaccine Information Sheet and instruction to access the V-Safe system.   [name] [name] was instructed to call 911 with any severe reactions post vaccine: Marland Kitchen Difficulty breathing  . Swelling of your face and throat  . A fast heartbeat  . A bad rash all over your body  . Dizziness and weakness    Immunizations Administered    Name Date Dose VIS Date Route   Pfizer COVID-19 Vaccine 07/10/2019 11:40 AM 0.3 mL 05/11/2019 Intramuscular   Manufacturer: [employer], [employer]   Lot: [id number]   NDC: [address]-5

## 2019-08-04 ENCOUNTER — Ambulatory Visit: Payer: Medicare PPO | Attending: Internal Medicine

## 2019-08-04 DIAGNOSIS — Z23 Encounter for immunization: Secondary | ICD-10-CM | POA: Insufficient documentation

## 2019-08-04 NOTE — Progress Notes (Signed)
   Covid-19 Vaccination Clinic  Name:  [name] [name]    MRN: [id number] DOB: 11-01-1940  08/04/2019  [name] was observed post Covid-19 immunization for 15 minutes without incident. She was provided with Vaccine Information Sheet and instruction to access the V-Safe system.   [name] was instructed to call 911 with any severe reactions post vaccine: Marland Kitchen Difficulty breathing  . Swelling of face and throat  . A fast heartbeat  . A bad rash all over body  . Dizziness and weakness   Immunizations Administered    Name Date Dose VIS Date Route   Pfizer COVID-19 Vaccine 08/04/2019  9:49 AM 0.3 mL 05/11/2019 Intramuscular   Manufacturer: [employer], [employer]   Lot: [id number]   NDC: [address]-5

## 2019-09-27 DIAGNOSIS — Z0000 Encounter for general adult medical examination without abnormal findings: Secondary | ICD-10-CM | POA: Diagnosis not present

## 2019-09-27 DIAGNOSIS — R413 Other amnesia: Secondary | ICD-10-CM | POA: Diagnosis not present

## 2019-09-27 DIAGNOSIS — E7800 Pure hypercholesterolemia, unspecified: Secondary | ICD-10-CM | POA: Diagnosis not present

## 2019-09-27 DIAGNOSIS — R42 Dizziness and giddiness: Secondary | ICD-10-CM | POA: Diagnosis not present

## 2020-02-05 DIAGNOSIS — R42 Dizziness and giddiness: Secondary | ICD-10-CM | POA: Diagnosis not present

## 2020-02-05 DIAGNOSIS — Z23 Encounter for immunization: Secondary | ICD-10-CM | POA: Diagnosis not present

## 2020-02-05 DIAGNOSIS — R413 Other amnesia: Secondary | ICD-10-CM | POA: Diagnosis not present

## 2020-02-05 DIAGNOSIS — S0512XA Contusion of eyeball and orbital tissues, left eye, initial encounter: Secondary | ICD-10-CM | POA: Diagnosis not present

## 2020-02-12 DIAGNOSIS — H52223 Regular astigmatism, bilateral: Secondary | ICD-10-CM | POA: Diagnosis not present

## 2020-02-12 DIAGNOSIS — H524 Presbyopia: Secondary | ICD-10-CM | POA: Diagnosis not present

## 2020-02-12 DIAGNOSIS — H5213 Myopia, bilateral: Secondary | ICD-10-CM | POA: Diagnosis not present

## 2020-02-12 DIAGNOSIS — H2513 Age-related nuclear cataract, bilateral: Secondary | ICD-10-CM | POA: Diagnosis not present

## 2020-03-07 ENCOUNTER — Other Ambulatory Visit (HOSPITAL_BASED_OUTPATIENT_CLINIC_OR_DEPARTMENT_OTHER): Payer: Self-pay | Admitting: Internal Medicine

## 2020-03-07 ENCOUNTER — Other Ambulatory Visit: Payer: Self-pay

## 2020-03-07 ENCOUNTER — Ambulatory Visit: Payer: Medicare PPO | Attending: Internal Medicine

## 2020-03-07 DIAGNOSIS — Z23 Encounter for immunization: Secondary | ICD-10-CM

## 2020-03-07 NOTE — Progress Notes (Signed)
   Covid-19 Vaccination Clinic  Name:  [name] [name]    MRN: [id number] DOB: January 20, 1941  03/07/2020  [name] was observed post Covid-19 immunization for 15 minutes without incident. She was provided with Vaccine Information Sheet and instruction to access the V-Safe system. Vaccinated by [employer].   [name] was instructed to call 911 with any severe reactions post vaccine: Marland Kitchen Difficulty breathing  . Swelling of face and throat  . A fast heartbeat  . A bad rash all over body  . Dizziness and weakness

## 2020-03-14 MED FILL — PFIZER-BIONTECH COVID-19 VA: 30 | 1 days supply | Qty: 0 | Fill #0

## 2020-03-17 ENCOUNTER — Ambulatory Visit: Payer: Self-pay

## 2020-04-07 ENCOUNTER — Ambulatory Visit: Payer: Medicare PPO

## 2020-04-10 ENCOUNTER — Encounter: Payer: Self-pay | Admitting: Neurology

## 2020-04-10 ENCOUNTER — Ambulatory Visit: Payer: Medicare PPO | Admitting: Neurology

## 2020-04-10 ENCOUNTER — Other Ambulatory Visit: Payer: Self-pay

## 2020-04-10 ENCOUNTER — Telehealth: Payer: Self-pay | Admitting: *Deleted

## 2020-04-10 VITALS — BP 116/64 | HR 58 | Ht 63.0 in | Wt 144.5 lb

## 2020-04-10 DIAGNOSIS — Z9181 History of falling: Secondary | ICD-10-CM | POA: Diagnosis not present

## 2020-04-10 DIAGNOSIS — R42 Dizziness and giddiness: Secondary | ICD-10-CM | POA: Diagnosis not present

## 2020-04-10 DIAGNOSIS — R413 Other amnesia: Secondary | ICD-10-CM

## 2020-04-10 DIAGNOSIS — H81399 Other peripheral vertigo, unspecified ear: Secondary | ICD-10-CM | POA: Diagnosis not present

## 2020-04-10 NOTE — Patient Instructions (Signed)
It was good to see you both again today.  For your intermittent vertigo, I recommend full evaluation with an ear, nose, and throat specialist, it sounds like you have received a referral from your primary care physician, Dr. Tiburcio Pea to see ENT.  We will proceed with a brain MRI with and without contrast to compare findings from 2017.  For your memory loss, I recommend that you continue with the medication, donepezil, as instructed by Dr. Tiburcio Pea.  You can consider increasing this to the next strength which is 10 mg daily.  Please be mindful that your heart rate is slightly low in the high 50s, it may be related to your scopolamine patch and/or the donepezil.  Please have Dr. Tiburcio Pea monitor this, especially if you decide to increase the donepezil.  We will call you with the MRI results.  Please follow-up routinely in this clinic in 3 months, sooner if needed.

## 2020-04-10 NOTE — Telephone Encounter (Signed)
Called Dr. Tiburcio Pea office (PCP) to get recent lab results faxed over while Dr. Frances Furbish is seeing pt today. They said last labs done 09/27/19. They will fax this over at [phone].

## 2020-04-10 NOTE — Progress Notes (Signed)
Subjective:    Patient ID: [name] [name] is a 79 y.o. female.  HPI     Huston Foley, MD, PhD [facility] [address], [address] [address] [place], [place] [address]  Dear Dr. Tiburcio Pea,   I saw your patient, [name] [name], upon your kind request, in my neurologic clinic today for evaluation of her vertigo and hearing loss. The patient is accompanied by her husband today. As you know, [name] [name] is an 28 year old right handed woman with an underlying medical history of reflux disease, hiatal hernia, esophageal stricture, arthritis, memory loss, varicose veins, hearing loss and vertigo, who reports intermittent vertigo spells.  She had 2 severe spells in the past several years.  She has been wearing a scopolamine patch every day, she changes it every 3 days.  She denies any hearing loss.  Husband is not concerned about her having any difficulty hearing.  He is worried about her memory loss.  She has been on donepezil 5 mg strength daily since September.  She seems to tolerate it.  She has not been driving a car lately.  She does have a license.  She is the youngest of 10 siblings, she has 1 sister with mental health problems and a brother with mental health problems, no sibling with memory loss and no other family member with dementia or memory loss as far as they recall.  He is worried about her short-term memory loss and gives a few examples.  She got confused as to her location one time.  She wanted to go home when she was at home.  This was in the context of packing for a vacation trip to the beach.  She also did not pack enough clothes but only 1 set of clothes.  She had taken most of her clothes out of her closet however.  She is a non-smoker and does not drink alcohol.  She drinks caffeine in the form of coffee, 1 cup in the morning and tea in the evening, maybe a couple cups of water per day on average.   She denies any current spells of vertigo or dizziness.  She had blood work  through your office which I reviewed from 09/27/2019.  She had a CBC with differential at the time which was normal, CMP was unremarkable, BUN 12 and creatinine 0.73, her lipid panel was benign, triglycerides 79, total cholesterol 196, LDL 117 HDL 64, TSH 1.09.  Per husband, she has been referred to ENT.  She has not had any physical therapy for vertigo.  She was given exercises to do for her inner ear crystals per husband but has not been doing them.  She does not exercise in any regular fashion, she used to walk but has not done so in a while.  I have evaluated her over 4 years ago for dizziness and balance problems.  She had testing through otolaryngology and an abnormal test results on VNG.    We proceeded with a brain MRI with and without contrast.  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   We notified her of her test results by phone call.  She was recently started on Aricept for memory loss.   Previously:   01/05/16: [name] [name] is a 9 year old right-handed woman with an underlying medical history of hiatal hernia, reflux  disease, esophageal stricture, long-standing history of dizziness and vertigo, arthritis, who reports intermittent dizziness for the past 4-5 months. She reports that this is different from her vertigo feeling. For vertigo she had [facility] the [facility] and also had trouble with the vertigo test per husband. Patient reportedly has had some memory issues for the past few years according to your records including some changes in personality or behavior as voiced by her husband and niece. Husband says that there is no niece involved in her care. He denies that there have been any major personality changes or behavioral issues. He does admit that they tend to argue a little bit more as they grow  olde together. He feels that her memory loss is probably in the realm of acceptable for age. He does not provide any other details. Patient denies that she has any significant memory issues. She drives. She reports no issues driving. She has no family history of Alzheimer's dementia or other type of memory loss. She is the youngest of 10 siblings. She has 1 sister left and one brother left, without any major medical or neurological problems as I understand. The patient has been retired. She used to work in an office. She has a high school education. They have 1 daughter in [place] and one son who lives in [place]. They have 2 grandchildren. She is a nonsmoker, does not drink alcohol, drinks coffee as well as tea. Maybe 2 bottles of water per day. She does not sleep very well. She has not been a good sleeper for years per husband.   She had recent vestibular evaluation at [facility]. She had several abnormalities, but Dix-Hallpike was negative, square were jerk nystagmus during gaze with fixation, abnormal gain during tracking, abnormal peak loss of the, latency and accuracy during saccades, abnormally slow but symmetrical optic kinetic testing, overall grossly abnormal VNG. During caloric testing she became sick and started vomiting unable to complete the test.  Her Past Medical History Is Significant For: Past Medical History:  Diagnosis Date  . Arthritis   . Dizziness   . Esophageal stricture   . Gastroesophageal reflux disease   . Hiatal hernia   . Hiatal hernia   . Varicose veins   . Vertigo    chronic with exacerbation    Her Past Surgical History Is Significant For: Past Surgical History:  Procedure Laterality Date  . BLADDER SUSPENSION     x 2  . ENDOVENOUS ABLATION SAPHENOUS VEIN W/ LASER Right 09-05-2013   right greater saphenous vein by Gretta Beganodd Early MD  . ENDOVENOUS ABLATION SAPHENOUS VEIN W/ LASER Left 10-18-2013   endovenous laser ablation left  greater saphenous vein by Gretta Beganodd Early MD  . VAGINAL HYSTERECTOMY      Her Family History Is Significant For: Family History  Problem Relation Age of Onset  . Breast cancer Sister   . Kidney failure Father   . Diabetes Father   . Heart attack Sister        was also a smoker  . Colon cancer Other        niece    Her Social History Is Significant For: Social History   Socioeconomic History  . Marital status: Married    Spouse name: Reita ClicheBobby  . Number of children: 2  . Years of education: HS  . Highest education level: Not on file  Occupational History  . Occupation: retired  Tobacco Use  . Smoking status: Never  Smoker  . Smokeless tobacco: Never Used  Substance and Sexual Activity  . Alcohol use: No  . Drug use: No  . Sexual activity: Not on file  Other Topics Concern  . Not on file  Social History Narrative   Drinks 1 cup of coffee a day    Social Determinants of Health   Financial Resource Strain:   . Difficulty of Paying Living Expenses: Not on file  Food Insecurity:   . Worried About Programme researcher, broadcasting/film/video in the Last Year: Not on file  . Ran Out of Food in the Last Year: Not on file  Transportation Needs:   . Lack of Transportation (Medical): Not on file  . Lack of Transportation (Non-Medical): Not on file  Physical Activity:   . Days of Exercise per Week: Not on file  . Minutes of Exercise per Session: Not on file  Stress:   . Feeling of Stress : Not on file  Social Connections:   . Frequency of Communication with Friends and Family: Not on file  . Frequency of Social Gatherings with Friends and Family: Not on file  . Attends Religious Services: Not on file  . Active Member of Clubs or Organizations: Not on file  . Attends Banker Meetings: Not on file  . Marital Status: Not on file    Her Allergies Are:  Allergies  Allergen Reactions  . Streptomycin   :   Her Current Medications Are:  Outpatient Encounter Medications as of 04/10/2020   Medication Sig  . donepezil (ARICEPT) 5 MG tablet Take 5 mg by mouth at bedtime.  . Multiple Vitamin (MULTIVITAMIN) tablet Take 1 tablet by mouth daily.  . TRANSDERM-SCOP 1.5 MG   . [DISCONTINUED] CRANBERRY EXTRACT PO Take by mouth.    . [DISCONTINUED] omeprazole (PRILOSEC) 20 MG capsule TAKE 1 CAPSULE BY MOUTH EVERY DAY *NEEDS APPT   No facility-administered encounter medications on file as of 04/10/2020.  : Review of Systems:  Out of a complete 14 point review of systems, all are reviewed and negative with the exception of these symptoms as listed below:  Review of Systems  Neurological:       RM 1 with husband. Paper referral from [name], MD for vertigo/hearing loss.  Husband said she does not have hearing loss. Has had vertigo since around 2003. Started experiencing this after riding in their motor home at that time. Having to use scopolamine patch often. Sx intermittent. Episodes are random. Sometimes goes a couple days with no issues. Denies any nausea with episodes or any falls.    Objective:  Neurological Exam  Physical Exam Physical Examination:   Vitals:   04/10/20 0937  BP: 116/64  Pulse: (!) 58    General Examination: The patient is a very pleasant 79 y.o. female in no acute distress. She appears well-developed and well-nourished and well groomed.  She denies any lightheadedness or orthostatic symptoms.  HEENT: Normocephalic, atraumatic, pupils are equal, round and reactive to light and accommodation.  Extraocular tracking is full, hearing is grossly intact, tympanic membranes clear bilaterally.  She has no vertiginous symptoms upon sudden changes in head position. Face is symmetric with normal facial animation and normal facial sensation. Speech is clear with no dysarthria noted. There is no hypophonia. There is no lip, neck/head, jaw or voice tremor. Neck is supple with full range of passive and active motion. There are no carotid bruits on auscultation.  Oropharynx exam reveals: moderate mouth dryness, adequate dental hygiene  and mild airway crowding. Mallampati is class II. Tongue protrudes centrally and palate elevates symmetrically.   Chest: Clear to auscultation without wheezing, rhonchi or crackles noted.  Heart: S1+S2+0, regular and normal without murmurs, rubs or gallops noted.   Abdomen: Soft, non-tender and non-distended with normal bowel sounds appreciated on auscultation.  Extremities: There is no pitting edema in the distal lower extremities bilaterally. Pedal pulses are intact.  Skin: Warm and dry without trophic changes noted. There are prominent spider veins around the distal lower extremities and feet.   Musculoskeletal: exam reveals prominent arthritic changes in both hands, particularly right more than left hand, in the finger joints.  Neurologically:  Mental status: The patient is awake, alert and oriented in all 4 spheres. Her immediate and remote memory, attention, language skills and fund of knowledge are mildly impaired. She defers to have her husband answer questions.  She indicates that he is the reason she is here.Mood is constricted and affect is somewhat blunted.   (On 01/05/2016: MMSE: 24/30, she missed 1 on date and one on month, one for Dr., she missed 2 points for spelling world backwards, CDT: 3/4, animal fluency was 12/m.)  Cranial nerves II - XII are as described above under HEENT exam. In addition: shoulder shrug is normal with equal shoulder height noted. Motor exam: Normal bulk, strength and tone is noted. There is no drift, tremor or rebound. Reflexes are 1+ in the upper extremities, trace in the lower extremities.  Toes are flexor bilaterally. Fine motor skills and coordination: intact with normal finger taps, normal hand movements, normal rapid alternating patting, normal foot taps and normal foot agility.  Cerebellar testing: No dysmetria or intention tremor on finger to nose testing. Heel to shin  is unremarkable bilaterally. There is no truncal or gait ataxia.  Sensory exam: intact to light touch in the upper and lower extremities.  Gait, station and balance: She stands easily. No veering to one side is noted. No leaning to one side is noted. Posture is age-appropriate and stance is narrow based. Gait shows normal stride length and normal pace. No problems turning are noted. She has preserved arm swing when walking.   No walking aid.              Assessment and Plan:    In summary, TASHONNA DESCOTEAUX is a very pleasant 79 year old female with an underlying medical history of hiatal hernia, reflux disease, intermittent vertigo, esophageal stricture, memory loss, and arthritis, who presents for evaluation of her vertigo and hearing loss.  She currently does not have any significant recent spell of vertigo.  She does report a fall in September.  She may have tripped over a curb.  They were at a restaurant at the time.  She is advised to seek evaluation through ENT.  It sounds like they have a referral already but no appointment has been made yet.  I had evaluated her for dizziness in the past.  We had proceeded with an MRI at the time.  I would like to repeat brain MRI with and without contrast for comparison.  She has a history of memory loss and was recently started on donepezil.  Of note, her heart rate was in the high 50s today.  She had a benign cardiac exam for me.  She does not seem to have any symptoms from this, it could be in part related to her daily use of scopolamine and/or addition of the need donepezil.  Her husband is  more concerned about her memory loss than anything else.  We talked about this as well today.  She is advised to stay better hydrated with water, increase her water intake to about 6 to 8 cups/day.  We also talked about the importance of physical activity, she is advised to start some form of modest daily exercise regimen such as walking on a day-to-day basis.  She does not have  to overdo it or do any type of significant strength training.  She is advised to pursue healthy eating habits.  Her husband indicates that they often eat out and do not always make the best choices when it comes to see meal selections.  If she has not had a recent B12 level checked, I would recommend this.  She has tolerated the donepezil.  With the next prescription, you could consider increasing it to 10 mg but I would be mindful of her heart rate.  Perhaps, after seeing ENT, she could consider coming off of the daily use of scopolamine.   I will see her back routinely after a couple months, sooner as needed. I suggested no new medications today. I did suggest that she increase her water intake and her physical activity.   I answered all their questions today and the patient and her husband were in agreement. Thank you very much for allowing me to participate in the care of this nice patient. If I can be of any further assistance to you please do not hesitate to call me at [phone].  Sincerely,   Huston Foley, MD, PhD

## 2020-04-11 LAB — COMPREHENSIVE METABOLIC PANEL
ALT: 12 IU/L (ref 0–32)
AST: 18 IU/L (ref 0–40)
Albumin/Globulin Ratio: 1.4 (ref 1.2–2.2)
Albumin: 4.1 g/dL (ref 3.6–4.6)
Alkaline Phosphatase: 90 IU/L (ref 44–121)
BUN/Creatinine Ratio: 16 (ref 12–28)
BUN: 12 mg/dL (ref 8–27)
Bilirubin Total: 0.5 mg/dL (ref 0.0–1.2)
CO2: 23 mmol/L (ref 20–29)
Calcium: 9.2 mg/dL (ref 8.7–10.3)
Chloride: 104 mmol/L (ref 96–106)
Creatinine, Ser: 0.77 mg/dL (ref 0.57–1.00)
GFR calc Af Amer: 84 mL/min/{1.73_m2} (ref >59)
GFR calc non Af Amer: 73 mL/min/{1.73_m2} (ref >59)
Globulin, Total: 3 g/dL (ref 1.5–4.5)
Glucose: 93 mg/dL (ref 65–99)
Potassium: 4.6 mmol/L (ref 3.5–5.2)
Sodium: 139 mmol/L (ref 134–144)
Total Protein: 7.1 g/dL (ref 6.0–8.5)

## 2020-04-14 ENCOUNTER — Telehealth: Payer: Self-pay | Admitting: Neurology

## 2020-04-14 NOTE — Telephone Encounter (Signed)
[name] [name]: [id number] (exp. 04/14/20 to 05/14/20) order sent to GI. They will reach out to the patient to schedule.

## 2020-05-02 ENCOUNTER — Ambulatory Visit
Admission: RE | Admit: 2020-05-02 | Discharge: 2020-05-02 | Disposition: A | Discharge status: Home / Self Care | Payer: Medicare PPO | Source: Ambulatory Visit | Attending: Neurology | Admitting: Neurology

## 2020-05-02 ENCOUNTER — Other Ambulatory Visit: Payer: Self-pay

## 2020-05-02 DIAGNOSIS — H81399 Other peripheral vertigo, unspecified ear: Secondary | ICD-10-CM

## 2020-05-02 DIAGNOSIS — Z9181 History of falling: Secondary | ICD-10-CM

## 2020-05-02 DIAGNOSIS — R413 Other amnesia: Secondary | ICD-10-CM

## 2020-05-02 DIAGNOSIS — R42 Dizziness and giddiness: Secondary | ICD-10-CM | POA: Diagnosis not present

## 2020-05-02 MED ORDER — GADOBENATE DIMEGLUMINE 529 MG/ML IV SOLN
13.0000 mL | Freq: Once | INTRAVENOUS | Status: AC | PRN
  Administered 2020-05-02: 13 mL via INTRAVENOUS

## 2020-05-05 ENCOUNTER — Telehealth: Payer: Self-pay

## 2020-05-05 NOTE — Progress Notes (Signed)
Please call patient and advise her that her brain MRI with and without contrast showed chronic and age-appropriate findings, no acute findings, no abnormal contrast uptake.

## 2020-05-05 NOTE — Telephone Encounter (Signed)
I called pt. No answer, left a detailed message on her cell phone, per DPR, advising her of results and asking her to call us back with questions or concerns.

## 2020-05-05 NOTE — Telephone Encounter (Signed)
-----   Message from Huston Foley, MD sent at 05/05/2020  8:08 AM EST ----- Please call patient and advise her that her brain MRI with and without contrast showed chronic and age-appropriate findings, no acute findings, no abnormal contrast uptake.

## 2020-06-13 DIAGNOSIS — R42 Dizziness and giddiness: Secondary | ICD-10-CM | POA: Diagnosis not present

## 2020-07-16 ENCOUNTER — Ambulatory Visit: Payer: Medicare PPO | Admitting: Neurology

## 2020-07-16 ENCOUNTER — Encounter: Payer: Self-pay | Admitting: Neurology

## 2020-07-16 VITALS — BP 130/78 | HR 81 | Ht 63.0 in | Wt 140.3 lb

## 2020-07-16 DIAGNOSIS — R42 Dizziness and giddiness: Secondary | ICD-10-CM | POA: Diagnosis not present

## 2020-07-16 NOTE — Progress Notes (Signed)
Subjective:    Patient ID: [name] [name] is a 80 y.o. female.  HPI     Interim history:   [name] [name] is an 57 year old right handed woman with an underlying medical history of reflux disease, hiatal hernia, esophageal stricture, arthritis, memory loss, varicose veins, hearing loss and vertigo, who Presents for follow-up consultation of her vertigo spells.  The patient is accompanied by her husband today.  I last saw her on 04/10/2020, at which time she was referred by her primary care physician for intermittent vertigo and hearing loss.  She had been using scopolamine patches.  She had recently started donepezil.  She was found to have bradycardia.  She was advised to get evaluated through ENT and request a referral through PCP for vertigo.  Neurological exam did not reveal any focal neurological findings.  She was advised to proceed with a brain MRI with and without contrast.  She had a brain MRI with and without contrast on 05/02/2020 and I reviewed the results:    IMPRESSION: This MRI of the brain with and without contrast shows the following: 1.   Mild generalized cortical atrophy that is typical for age but mildly progressed compared to the 2017 MRI. 2.   Some scattered T2/FLAIR hyperintense foci in the subcortical and deep white matter consistent with minimal chronic microvascular ischemic change, slightly progressed compared to the 2017 MRI. 3.   No acute findings.. 4.   Normal enhancement pattern. We called her with her test results.  Today, 07/16/2020: She reports feeling stable, memory is stable, no longer on donepezil with her primary care physician, he reports that she ran out of the prescription and they were under the impression that he/she would get a refill from this office but the also did not call here for refill for her donepezil.  Of note, I had voiced concern regarding her bradycardia while she was on Aricept and scopolamine.  She is still on scopolamine patches on a regular basis,  not daily but not sure how often she actually uses it.  She is not providing additional information as to how often she uses it, she uses the scopolamine as needed and feels improved from it.  She has seen ENT and is supposed to have additional testing through [facility] for balance testing, pending for March.  She had a consultation with the ENT nurse practitioner on 06/13/2020 and was cautioned regarding the use of scopolamine for fear of drowsiness and confusion.  The patient does not hydrate with water very much.  She reports that she drinks 2-3 bottles per day but husband feels that it is quite a bit less.  She drinks caffeine in the form of coffee in the morning and tea later in the day.  She has not fallen.  Unclear how long she has been off of the Aricept.  He reports that she has another prescription for the scopolamine waiting to be picked up.  The patient's allergies, current medications, family history, past medical history, past social history, past surgical history and problem list were reviewed and updated as appropriate.   Previously:   04/10/20: (She) reports intermittent vertigo spells.  She had 2 severe spells in the past several years.  She has been wearing a scopolamine patch every day, she changes it every 3 days.  She denies any hearing loss.  Husband is not concerned about her having any difficulty hearing.  He is worried about her memory loss.  She has been on donepezil  5 mg strength daily since September.  She seems to tolerate it.  She has not been driving a car lately.  She does have a license.  She is the youngest of 10 siblings, she has 1 sister with mental health problems and a brother with mental health problems, no sibling with memory loss and no other family member with dementia or memory loss as far as they recall.  He is worried about her short-term memory loss and gives a few examples.  She got confused as to her location one time.  She wanted to go home when she was at  home.  This was in the context of packing for a vacation trip to the beach.  She also did not pack enough clothes but only 1 set of clothes.  She had taken most of her clothes out of her closet however.   She is a non-smoker and does not drink alcohol.  She drinks caffeine in the form of coffee, 1 cup in the morning and tea in the evening, maybe a couple cups of water per day on average.   She denies any current spells of vertigo or dizziness.  She had blood work through your office which I reviewed from 09/27/2019.  She had a CBC with differential at the time which was normal, CMP was unremarkable, BUN 12 and creatinine 0.73, her lipid panel was benign, triglycerides 79, total cholesterol 196, LDL 117 HDL 64, TSH 1.09.   Per husband, she has been referred to ENT.  She has not had any physical therapy for vertigo.  She was given exercises to do for her inner ear crystals per husband but has not been doing them.  She does not exercise in any regular fashion, she used to walk but has not done so in a while.   I have evaluated her over 4 years ago for dizziness and balance problems.  She had testing through otolaryngology and an abnormal test results on VNG.     We proceeded with a brain MRI with and without contrast.   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   We notified her of her test results by phone call.   She was recently started on Aricept for memory loss.     01/05/16: [name] [name] is a 80 year old right-handed woman with an underlying medical history of hiatal hernia, reflux disease, esophageal stricture, long-standing history of dizziness and vertigo, arthritis, who reports intermittent dizziness for the past 4-5 months. She reports that this is different from her vertigo feeling. For vertigo she had [facility] and also had trouble with the vertigo test per husband. Patient reportedly has had some memory issues for the past few years according to your records including some changes in personality or behavior as voiced by her husband and niece. Husband says that there is no niece involved in her care. He denies that there have been any major personality changes or behavioral issues. He does admit that they tend to argue a little bit more as they grow olde together. He feels that her memory loss is probably in the realm of acceptable for age. He does not provide any other details. Patient denies that she has any significant memory issues. She drives. She reports no issues driving. She has no family history of Alzheimer's dementia  or other type of memory loss. She is the youngest of 10 siblings. She has 1 sister left and one brother left, without any major medical or neurological problems as I understand. The patient has been retired. She used to work in an office. She has a high school education. They have 1 daughter in [place] and one son who lives in [place]. They have 2 grandchildren. She is a nonsmoker, does not drink alcohol, drinks coffee as well as tea. Maybe 2 bottles of water per day. She does not sleep very well. She has not been a good sleeper for years per husband.   She had recent vestibular evaluation at [facility]. She had several abnormalities, but Dix-Hallpike was negative, square were jerk nystagmus during gaze with fixation, abnormal gain during tracking, abnormal peak loss of the, latency and accuracy during saccades, abnormally slow but symmetrical optic kinetic testing, overall grossly abnormal VNG. During caloric testing she became sick and started vomiting unable to complete the test.   Her Past Medical History Is Significant For: Past Medical History:  Diagnosis Date  . Arthritis   . Dizziness   . Esophageal stricture   . Gastroesophageal  reflux disease   . Hiatal hernia   . Hiatal hernia   . Varicose veins   . Vertigo    chronic with exacerbation    Her Past Surgical History Is Significant For: Past Surgical History:  Procedure Laterality Date  . BLADDER SUSPENSION     x 2  . ENDOVENOUS ABLATION SAPHENOUS VEIN W/ LASER Right 09-05-2013   right greater saphenous vein by Gretta Began MD  . ENDOVENOUS ABLATION SAPHENOUS VEIN W/ LASER Left 10-18-2013   endovenous laser ablation left greater saphenous vein by Gretta Began MD  . VAGINAL HYSTERECTOMY      Her Family History Is Significant For: Family History  Problem Relation Age of Onset  . Breast cancer Sister   . Kidney failure Father   . Diabetes Father   . Heart attack Sister        was also a smoker  . Colon cancer Other        niece    Her Social History Is Significant For: Social History   Socioeconomic History  . Marital status: Married    Spouse name: [name] [name]  . Number of children: 2  . Years of education: HS  . Highest education level: Not on file  Occupational History  . Occupation: retired  Tobacco Use  . Smoking status: Never Smoker  . Smokeless tobacco: Never Used  Substance and Sexual Activity  . Alcohol use: No  . Drug use: No  . Sexual activity: Not on file  Other Topics Concern  . Not on file  Social History Narrative   Drinks 1 cup of coffee a day    Social Determinants of Health   Financial Resource Strain: Not on file  Food Insecurity: Not on file  Transportation Needs: Not on file  Physical Activity: Not on file  Stress: Not on file  Social Connections: Not on file    Her Allergies Are:  Allergies  Allergen Reactions  . Streptomycin   :   Her Current Medications Are:  Outpatient Encounter Medications as of 07/16/2020  Medication Sig  . donepezil (ARICEPT) 5 MG tablet Take 5 mg by mouth at bedtime.  . Multiple Vitamin (MULTIVITAMIN) tablet Take 1 tablet by mouth daily.  . TRANSDERM-SCOP 1.5 MG    No  facility-administered encounter  medications on file as of 07/16/2020.  :  Review of Systems:  Out of a complete 14 point review of systems, all are reviewed and negative with the exception of these symptoms as listed below: Review of Systems  Neurological:       Here for 3 month f/u. Reports she has been doing well since last visit, no change.     Objective:  Neurological Exam  Physical Exam Physical Examination:   Vitals:   07/16/20 1255  BP: 130/78  Pulse: 81  SpO2: 97%    General Examination: The patient is a very pleasant 80 y.o. female in no acute distress. She appears well-developed and well-nourished and well groomed.   HEENT:Normocephalic, atraumatic, pupils are equal, round and reactive to light, extraocular tracking is well-preserved.  Hearing is grossly intact.  Face is symmetric with normal facial animation.  Speech is clear without dysarthria, hypophonia or voice tremor.  No carotid bruits.  Airway examination reveals moderate mouth dryness, adequate dental hygiene, tongue protrudes centrally and palate elevates symmetrically.   Chest:Clear to auscultation without wheezing, rhonchi or crackles noted.  Heart:S1+S2+0, regular and normal without murmurs, rubs or gallops noted.   Abdomen:Soft, non-tender and non-distended.  Extremities:There isnoobvious edema in the distal lower extremities bilaterally.  Skin: Warm and dry without trophic changes noted.   Musculoskeletal: exam reveals prominent arthritic changes in both hands, particularly right more than left hand, in the finger joints.  Neurologically:  Mental status: The patient is awake, alert and oriented in all 4 spheres.Herimmediate and remote memory, attention, language skills and fund of knowledge are mildly impaired.   Mood isconstrictedand affect is somewhat blunted.  (On8/11/2015: MMSE: 24/30,she missed 1 on date and one on month, one for Dr., she missed 2 points for spelling world  backwards,CDT: 3/4,animal fluency was 12/m.)  Cranial nerves II - XII are as described above under HEENT exam. In addition: shoulder shrug is normal with equal shoulder height noted. Motor exam: Normal bulk, strength and tone is noted. There is no drift, tremor or rebound. Fine motor skills and coordination: Grossly intact.    Cerebellar testing: No dysmetria or intention tremor on finger to nose testing. Heel to shin is unremarkable bilaterally. There is no truncal or gait ataxia.  Sensory exam: intact to light touch in the upper and lower extremities.  Gait, station and balance:Shestands without major difficulty, denies any orthostatic lightheadedness or vertiginous symptoms.  She walks without a walking aid, slight insecurity with turns, preserved arm swing, no shuffling.    Assessmentand Plan:   In summary,[name] L Riceis a very pleasant 79 year oldfemalewith an underlying medical history of hiatal hernia, reflux disease, intermittent vertigo, esophageal stricture, memory loss, and arthritis, who presents for follow-up consultation of her vertigo and history of hearing loss.  She has not had a recent hearing test but is scheduled for evaluation through [facility] in March.  She had a consultation with ENT in January 2022.  She had some orthostatic dizziness at the time.  She has had bradycardia as noted last time when she was on both Scopolamine and Aricept.  She is no longer on Aricept, unclear when she stopped it.  They are advised to talk to her primary care physician about restarting the Aricept, I do not feel comfortable restarting it so long as she is still on the scopolamine patches. I am not fully sure how often she takes the patch, she reports as needed but is not clear on the frequency of this.  She has another prescription pending for pickup.  She is advised to talk to her primary care physician about coming off of the scopolamine patches altogether and then restarting the  Aricept.  I had evaluated her for dizziness in the past.  She had a brain MRI in 2017 and recent brain MRI did not show any acute findings and normal enhancement pattern, mostly age-appropriate findings. She was given written instructions today.  She is advised to follow-up in this clinic to see one of our nurse practitioners in 6 months, sooner if needed.

## 2020-07-16 NOTE — Patient Instructions (Addendum)
It was good to see you again today.  I would like for you to talk to Dr. Tiburcio Pea about the memory medication donepezil as he start this medication.  I have never prescribed it for you and you were referred for dizziness for which I have recommended the following:   Please increase your water intake to about 6 cups of water per day or 3 bottles of the 16.9 ounce size.  If you would like to restart the donepezil 5 mg strength once daily, please talk to Dr. Tiburcio Pea about this.  You have had the scopolamine patches and I am not sure how often you actually use it but together with the Aricept it can cause low heart rate and I would like for you to discuss with him coming off of the Scopolamine altogether before you get a prescription from me for the Aricept.  You can talk to him about maintaining the Aricept through his office and talk to him about coming off of the scopolamine patches.  Please follow-up in 6 months with a nurse practitioner in this office.  Please keep your appointment with ENT for balance testing as scheduled for March.

## 2020-09-23 ENCOUNTER — Other Ambulatory Visit: Payer: Self-pay

## 2020-09-23 ENCOUNTER — Ambulatory Visit: Payer: Medicare PPO | Attending: Internal Medicine

## 2020-09-23 ENCOUNTER — Other Ambulatory Visit (HOSPITAL_BASED_OUTPATIENT_CLINIC_OR_DEPARTMENT_OTHER): Payer: Self-pay

## 2020-09-23 DIAGNOSIS — Z23 Encounter for immunization: Secondary | ICD-10-CM

## 2020-09-23 MED ORDER — PFIZER-BIONT COVID-19 VAC-TRIS 30 MCG/0.3ML IM SUSP
INTRAMUSCULAR | 0 refills | Status: DC
Start: 2020-09-23 — End: 2021-08-13
  Filled 2020-09-23: qty 0.3, 1d supply, fill #0

## 2020-09-23 NOTE — Progress Notes (Signed)
   Covid-19 Vaccination Clinic  Name:  [name] [name]    MRN: [id number] DOB: [birth year]  09/23/2020  [name] was observed post Covid-19 immunization for 15 minutes without incident. She was provided with Vaccine Information Sheet and instruction to access the V-Safe system.   Ms. Spells was instructed to call 911 with any severe reactions post vaccine: Marland Kitchen Difficulty breathing  . Swelling of face and throat  . A fast heartbeat  . A bad rash all over body  . Dizziness and weakness   Immunizations Administered    Name Date Dose VIS Date Route   PFIZER Comrnaty(Gray TOP) Covid-19 Vaccine 09/23/2020  9:59 AM 0.3 mL 05/08/2020 Intramuscular   Manufacturer: [employer], [employer]   Lot: [id number]   NDC: [id number]

## 2021-01-14 ENCOUNTER — Ambulatory Visit: Payer: Medicare PPO | Admitting: Adult Health

## 2021-01-14 ENCOUNTER — Other Ambulatory Visit: Payer: Self-pay

## 2021-01-14 ENCOUNTER — Encounter: Payer: Self-pay | Admitting: Adult Health

## 2021-01-14 VITALS — BP 126/62 | HR 66 | Ht 63.0 in | Wt 141.0 lb

## 2021-01-14 DIAGNOSIS — R42 Dizziness and giddiness: Secondary | ICD-10-CM | POA: Diagnosis not present

## 2021-01-14 DIAGNOSIS — R413 Other amnesia: Secondary | ICD-10-CM | POA: Diagnosis not present

## 2021-01-14 NOTE — Patient Instructions (Addendum)
Your Plan: Schedule testing at [facility]  Consider Neuropsychiatrist testing for memory pending results from [facility]   Thank you for coming to see [place] at [facility]. I hope we have been able to provide you high quality care today.  You may receive a patient satisfaction survey over the next few weeks. We would appreciate your feedback and comments so that we may continue to improve ourselves and the health of our patients.

## 2021-01-14 NOTE — Progress Notes (Addendum)
PATIENT: [name] [name] DOB: [birth year]  REASON FOR VISIT: follow up HISTORY FROM: patient PRIMARY NEUROLOGIST:   HISTORY OF PRESENT ILLNESS: Today 01/14/21:  [name] [name] is an 80 year old female with a history of vertigo and memory disturbance.  She returns today for follow-up.  The patient states that she can use a scopolamine patch on at all times.  However upon further clarification she does not take it off after 24 hours.  Is unclear how often she places a new patch.  The patient states that when she wears that she does not have any vertigo episodes.  The patient was found to have additional testing to [facility] for balance and hearing but she canceled this appointment.  The patient was on Aricept in the past but stopped it due to dizziness.  It is unclear who is managing her memory at this point.  Family states that PCP told him to address with our office.  The patient states that she does not feel that she has any problems with her memory.  Her husband states that she does have an issue with short-term memory.  Does not remember conversations.  She is able to complete all ADLs independently.  She no longer operates a motor vehicle.  She manages her own medications.  Her husband manages the finances.  She returns today for an evaluation.  HISTORY (Copied from Dr.Athar's note) 07/16/2020: She reports feeling stable, memory is stable, no longer on donepezil with her primary care physician, he reports that she ran out of the prescription and they were under the impression that he/she would get a refill from this office but the also did not call here for refill for her donepezil.  Of note, I had voiced concern regarding her bradycardia while she was on Aricept and scopolamine.  She is still on scopolamine patches on a regular basis, not daily but not sure how often she actually uses it.  She is not providing additional information as to how often she uses it, she uses the scopolamine as needed and  feels improved from it.  She has seen ENT and is supposed to have additional testing through [facility] for balance testing, pending for March.  She had a consultation with the ENT nurse practitioner on 06/13/2020 and was cautioned regarding the use of scopolamine for fear of drowsiness and confusion.  The patient does not hydrate with water very much.  She reports that she drinks 2-3 bottles per day but husband feels that it is quite a bit less.  She drinks caffeine in the form of coffee in the morning and tea later in the day.  She has not fallen.  Unclear how long she has been off of the Aricept.  He reports that she has another prescription for the scopolamine waiting to be picked up.    REVIEW OF SYSTEMS: Out of a complete 14 system review of symptoms, the patient complains only of the following symptoms, and all other reviewed systems are negative.  ALLERGIES: Allergies  Allergen Reactions   Streptomycin     HOME MEDICATIONS: Outpatient Medications Prior to Visit  Medication Sig Dispense Refill   donepezil (ARICEPT) 5 MG tablet Take 5 mg by mouth at bedtime.     Multiple Vitamin (MULTIVITAMIN) tablet Take 1 tablet by mouth daily.     TRANSDERM-SCOP 1.5 MG      COVID-19 mRNA Vac-TriS, Pfizer, (PFIZER-BIONT COVID-19 VAC-TRIS) SUSP injection Inject into the muscle. 0.3 mL 0  COVID-19 mRNA vaccine, Pfizer, 30 MCG/0.3ML injection INJECT AS DIRECTED .3 mL 0   No facility-administered medications prior to visit.    PAST MEDICAL HISTORY: Past Medical History:  Diagnosis Date   Arthritis    Dizziness    Esophageal stricture    Gastroesophageal reflux disease    Hiatal hernia    Hiatal hernia    Varicose veins    Vertigo    chronic with exacerbation    PAST SURGICAL HISTORY: Past Surgical History:  Procedure Laterality Date   BLADDER SUSPENSION     x 2   ENDOVENOUS ABLATION SAPHENOUS VEIN W/ LASER Right 09-05-2013   right greater saphenous vein by Gretta Began MD   ENDOVENOUS  ABLATION SAPHENOUS VEIN W/ LASER Left 10-18-2013   endovenous laser ablation left greater saphenous vein by Gretta Began MD   VAGINAL HYSTERECTOMY      FAMILY HISTORY: Family History  Problem Relation Age of Onset   Breast cancer Sister    Kidney failure Father    Diabetes Father    Heart attack Sister        was also a smoker   Colon cancer Other        niece    SOCIAL HISTORY: Social History   Socioeconomic History   Marital status: Married    Spouse name: Reita Cliche   Number of children: 2   Years of education: HS   Highest education level: Not on file  Occupational History   Occupation: retired  Tobacco Use   Smoking status: Never   Smokeless tobacco: Never  Substance and Sexual Activity   Alcohol use: No   Drug use: No   Sexual activity: Not on file  Other Topics Concern   Not on file  Social History Narrative   Drinks 1 cup of coffee a day    Lives at home with husband    Social Determinants of Health   Financial Resource Strain: Not on file  Food Insecurity: Not on file  Transportation Needs: Not on file  Physical Activity: Not on file  Stress: Not on file  Social Connections: Not on file  Intimate Partner Violence: Not on file      PHYSICAL EXAM  Vitals:   01/14/21 1459  BP: 126/62  Pulse: 66  Weight: 141 lb (64 kg)  Height: 5\' 3"  (1.6 m)   Body mass index is 24.98 kg/m.  Generalized: Well developed, in no acute distress   Neurological examination  Mentation: Alert oriented to time, place, history taking. Follows all commands speech and language fluent Cranial nerve II-XII: Pupils were equal round reactive to light. Extraocular movements were full, visual field were full on confrontational test. Facial sensation and strength were normal. Uvula tongue midline. Head turning and shoulder shrug  were normal and symmetric. Motor: The motor testing reveals 5 over 5 strength of all 4 extremities. Good symmetric motor tone is noted throughout.   Sensory: Sensory testing is intact to soft touch on all 4 extremities. No evidence of extinction is noted.  Coordination: Cerebellar testing reveals good finger-nose-finger and heel-to-shin bilaterally.  Gait and station: Gait is normal.  Reflexes: Deep tendon reflexes are symmetric and normal bilaterally.   DIAGNOSTIC DATA (LABS, IMAGING, TESTING) - I reviewed patient records, labs, notes, testing and imaging myself where available.  Lab Results  Component Value Date   WBC 8.8 06/07/2011   HGB 13.9 06/07/2011   HCT 40.8 06/07/2011   MCV 93.4 06/07/2011   PLT 248.0 06/07/2011  Component Value Date/Time   NA 139 04/10/2020 1046   K 4.6 04/10/2020 1046   CL 104 04/10/2020 1046   CO2 23 04/10/2020 1046   GLUCOSE 93 04/10/2020 1046   GLUCOSE 103 (H) 06/07/2011 1628   BUN 12 04/10/2020 1046   CREATININE 0.77 04/10/2020 1046   CALCIUM 9.2 04/10/2020 1046   PROT 7.1 04/10/2020 1046   ALBUMIN 4.1 04/10/2020 1046   AST 18 04/10/2020 1046   ALT 12 04/10/2020 1046   ALKPHOS 90 04/10/2020 1046   BILITOT 0.5 04/10/2020 1046   GFRNONAA 73 04/10/2020 1046   GFRAA 84 04/10/2020 1046   Lab Results  Component Value Date   CHOL 213 (H) 06/07/2011   HDL 54.50 06/07/2011   LDLDIRECT 137.0 06/07/2011   TRIG 97.0 06/07/2011   CHOLHDL 4 06/07/2011   No results found for: HGBA1C No results found for: VITAMINB12 No results found for: TSH    ASSESSMENT AND PLAN 80 y.o. year old female  has a past medical history of Arthritis, Dizziness, Esophageal stricture, Gastroesophageal reflux disease, Hiatal hernia, Hiatal hernia, Varicose veins, and Vertigo. here with :  1.  Vertigo  -Advised the patient that scopolamine patch can cause confusion, caution the patient about using it continuously. -Patient plans to reschedule her appointment at [facility] for further testing.  2.  Memory disturbance  - MMSE 20/30 -Discussed neuropsychological evaluation however the patient and her husband  want a wait till after her appointment at [facility]. -Also discussed adding a medication such as Namenda but again they deferred until after her appointment at [facility].  Their hope is that this appointment will help her get off the scopolamine patch.  Follow-up in 6 months or sooner if needed     Butch Penny, MSN, NP-C 01/14/2021, 3:14 PM [facility] [address] [place], [place] [address] [phone]  I reviewed the above note and documentation by the Nurse Practitioner and agree with the history, exam, assessment and plan as outlined above. I was available for consultation. Huston Foley, MD, PhD [facility])

## 2021-02-13 DIAGNOSIS — H5213 Myopia, bilateral: Secondary | ICD-10-CM | POA: Diagnosis not present

## 2021-02-13 DIAGNOSIS — H524 Presbyopia: Secondary | ICD-10-CM | POA: Diagnosis not present

## 2021-02-13 DIAGNOSIS — H52223 Regular astigmatism, bilateral: Secondary | ICD-10-CM | POA: Diagnosis not present

## 2021-02-13 DIAGNOSIS — H2513 Age-related nuclear cataract, bilateral: Secondary | ICD-10-CM | POA: Diagnosis not present

## 2021-03-16 DIAGNOSIS — H8193 Unspecified disorder of vestibular function, bilateral: Secondary | ICD-10-CM | POA: Diagnosis not present

## 2021-03-16 DIAGNOSIS — H8111 Benign paroxysmal vertigo, right ear: Secondary | ICD-10-CM | POA: Diagnosis not present

## 2021-03-17 ENCOUNTER — Telehealth: Payer: Self-pay | Admitting: Adult Health

## 2021-03-17 DIAGNOSIS — R413 Other amnesia: Secondary | ICD-10-CM

## 2021-03-17 NOTE — Telephone Encounter (Signed)
Noted. Looks like visit was yesterday and is viewable in Epic.

## 2021-03-17 NOTE — Telephone Encounter (Signed)
Pt's husband [name] called wanting to inform Butch Penny that his wife did have her balance test done with Laveda Norman at [facility] phone number [phone].

## 2021-03-19 ENCOUNTER — Ambulatory Visit: Payer: Medicare PPO | Attending: Internal Medicine

## 2021-03-19 ENCOUNTER — Other Ambulatory Visit (HOSPITAL_BASED_OUTPATIENT_CLINIC_OR_DEPARTMENT_OTHER): Payer: Self-pay

## 2021-03-19 ENCOUNTER — Other Ambulatory Visit: Payer: Self-pay

## 2021-03-19 DIAGNOSIS — Z23 Encounter for immunization: Secondary | ICD-10-CM

## 2021-03-19 MED ORDER — PFIZER COVID-19 VAC BIVALENT 30 MCG/0.3ML IM SUSP
INTRAMUSCULAR | 0 refills | Status: DC
Start: 2021-03-19 — End: 2021-08-13
  Filled 2021-03-19: qty 0.3, 1d supply, fill #0

## 2021-03-19 NOTE — Telephone Encounter (Signed)
I called pt, husband said I could speak him (DPR).  I relayed that MM/NP did receive notes from [facility], receommendations, pt did not want to pursue,  she is using patch which she states helps. From last ofv note MM/NP recommend neuropsychology referral once seen by [facility]. Per husband pt having memory loss, ST.  I relayed with this referral they are scheduling out 6 months, if ordered, it is something that can be cancelled if pt not wanting to proceed.  He was ok to do this.

## 2021-03-19 NOTE — Telephone Encounter (Signed)
Order placed

## 2021-03-19 NOTE — Addendum Note (Signed)
Addended by: Enedina Finner on: 03/19/2021 10:17 AM   Modules accepted: Orders

## 2021-03-19 NOTE — Progress Notes (Signed)
   Covid-19 Vaccination Clinic  Name:  [name] [name]    MRN: [id number] DOB: [birth year]  03/19/2021  Ms. [name] [name] was observed post Covid-19 immunization for 15 minutes without incident. She was provided with Vaccine Information Sheet and instruction to access the V-Safe system.   Ms. [name] [name] was instructed to call 911 with any severe reactions post vaccine: Difficulty breathing  Swelling of face and throat  A fast heartbeat  A bad rash all over body  Dizziness and weakness   Immunizations Administered     Name Date Dose VIS Date Route   Pfizer Covid-19 Vaccine Bivalent Booster 03/19/2021  2:33 PM 0.3 mL 01/28/2021 Intramuscular   Manufacturer: [employer], [employer]   Lot: [id number]   NDC: [phone]

## 2021-03-23 NOTE — Telephone Encounter (Signed)
Referral sent to [facility] Neuropsychology. Phone: [phone].

## 2021-04-02 DIAGNOSIS — Z23 Encounter for immunization: Secondary | ICD-10-CM | POA: Diagnosis not present

## 2021-07-21 DIAGNOSIS — R413 Other amnesia: Secondary | ICD-10-CM | POA: Diagnosis not present

## 2021-07-21 DIAGNOSIS — E7800 Pure hypercholesterolemia, unspecified: Secondary | ICD-10-CM | POA: Diagnosis not present

## 2021-07-21 DIAGNOSIS — Z0000 Encounter for general adult medical examination without abnormal findings: Secondary | ICD-10-CM | POA: Diagnosis not present

## 2021-07-23 ENCOUNTER — Ambulatory Visit: Payer: Medicare PPO | Admitting: Adult Health

## 2021-07-24 DIAGNOSIS — L218 Other seborrheic dermatitis: Secondary | ICD-10-CM | POA: Diagnosis not present

## 2021-07-24 DIAGNOSIS — C44329 Squamous cell carcinoma of skin of other parts of face: Secondary | ICD-10-CM | POA: Diagnosis not present

## 2021-07-24 DIAGNOSIS — Z85828 Personal history of other malignant neoplasm of skin: Secondary | ICD-10-CM | POA: Diagnosis not present

## 2021-08-13 ENCOUNTER — Encounter (HOSPITAL_COMMUNITY): Payer: Self-pay

## 2021-08-13 ENCOUNTER — Other Ambulatory Visit: Payer: Self-pay

## 2021-08-13 ENCOUNTER — Emergency Department (HOSPITAL_COMMUNITY): Payer: Medicare PPO

## 2021-08-13 ENCOUNTER — Inpatient Hospital Stay (HOSPITAL_COMMUNITY)
Admission: EM | Admit: 2021-08-13 | Discharge: 2021-08-15 | DRG: 689 | Disposition: A | Discharge status: Home / Self Care | Payer: Medicare PPO | Source: Ambulatory Visit | Attending: Internal Medicine | Admitting: Internal Medicine

## 2021-08-13 DIAGNOSIS — R410 Disorientation, unspecified: Secondary | ICD-10-CM | POA: Diagnosis not present

## 2021-08-13 DIAGNOSIS — R42 Dizziness and giddiness: Principal | ICD-10-CM

## 2021-08-13 DIAGNOSIS — Z803 Family history of malignant neoplasm of breast: Secondary | ICD-10-CM | POA: Diagnosis not present

## 2021-08-13 DIAGNOSIS — Z8249 Family history of ischemic heart disease and other diseases of the circulatory system: Secondary | ICD-10-CM | POA: Diagnosis not present

## 2021-08-13 DIAGNOSIS — R413 Other amnesia: Secondary | ICD-10-CM | POA: Diagnosis present

## 2021-08-13 DIAGNOSIS — R2681 Unsteadiness on feet: Secondary | ICD-10-CM

## 2021-08-13 DIAGNOSIS — N3000 Acute cystitis without hematuria: Secondary | ICD-10-CM | POA: Diagnosis not present

## 2021-08-13 DIAGNOSIS — Z90710 Acquired absence of both cervix and uterus: Secondary | ICD-10-CM

## 2021-08-13 DIAGNOSIS — Z20822 Contact with and (suspected) exposure to covid-19: Secondary | ICD-10-CM | POA: Diagnosis present

## 2021-08-13 DIAGNOSIS — N390 Urinary tract infection, site not specified: Secondary | ICD-10-CM | POA: Diagnosis present

## 2021-08-13 DIAGNOSIS — M1990 Unspecified osteoarthritis, unspecified site: Secondary | ICD-10-CM | POA: Diagnosis present

## 2021-08-13 DIAGNOSIS — Z87440 Personal history of urinary (tract) infections: Secondary | ICD-10-CM

## 2021-08-13 DIAGNOSIS — R4182 Altered mental status, unspecified: Secondary | ICD-10-CM

## 2021-08-13 DIAGNOSIS — G9340 Encephalopathy, unspecified: Secondary | ICD-10-CM | POA: Diagnosis present

## 2021-08-13 DIAGNOSIS — Z841 Family history of disorders of kidney and ureter: Secondary | ICD-10-CM | POA: Diagnosis not present

## 2021-08-13 DIAGNOSIS — F0390 Unspecified dementia without behavioral disturbance: Secondary | ICD-10-CM | POA: Diagnosis present

## 2021-08-13 DIAGNOSIS — Z833 Family history of diabetes mellitus: Secondary | ICD-10-CM | POA: Diagnosis not present

## 2021-08-13 DIAGNOSIS — Z781 Physical restraint status: Secondary | ICD-10-CM

## 2021-08-13 DIAGNOSIS — R4781 Slurred speech: Secondary | ICD-10-CM | POA: Diagnosis present

## 2021-08-13 DIAGNOSIS — B9620 Unspecified Escherichia coli [E. coli] as the cause of diseases classified elsewhere: Secondary | ICD-10-CM | POA: Diagnosis present

## 2021-08-13 DIAGNOSIS — E860 Dehydration: Secondary | ICD-10-CM | POA: Diagnosis not present

## 2021-08-13 DIAGNOSIS — R5381 Other malaise: Secondary | ICD-10-CM | POA: Diagnosis present

## 2021-08-13 DIAGNOSIS — G9341 Metabolic encephalopathy: Secondary | ICD-10-CM | POA: Diagnosis present

## 2021-08-13 DIAGNOSIS — E872 Acidosis, unspecified: Secondary | ICD-10-CM | POA: Diagnosis present

## 2021-08-13 DIAGNOSIS — Z800 Family history of malignant neoplasm of digestive organs: Secondary | ICD-10-CM | POA: Diagnosis not present

## 2021-08-13 DIAGNOSIS — R0689 Other abnormalities of breathing: Secondary | ICD-10-CM | POA: Diagnosis not present

## 2021-08-13 DIAGNOSIS — R9431 Abnormal electrocardiogram [ECG] [EKG]: Secondary | ICD-10-CM | POA: Diagnosis not present

## 2021-08-13 DIAGNOSIS — I6523 Occlusion and stenosis of bilateral carotid arteries: Secondary | ICD-10-CM | POA: Diagnosis not present

## 2021-08-13 DIAGNOSIS — R414 Neurologic neglect syndrome: Secondary | ICD-10-CM | POA: Diagnosis not present

## 2021-08-13 DIAGNOSIS — I959 Hypotension, unspecified: Secondary | ICD-10-CM | POA: Diagnosis not present

## 2021-08-13 LAB — APTT: aPTT: 28 seconds (ref 24–36)

## 2021-08-13 LAB — DIFFERENTIAL
Abs Immature Granulocytes: 0.04 10*3/uL (ref 0.00–0.07)
Basophils Absolute: 0.1 10*3/uL (ref 0.0–0.1)
Basophils Relative: 1 %
Eosinophils Absolute: 0 10*3/uL (ref 0.0–0.5)
Eosinophils Relative: 0 %
Immature Granulocytes: 0 %
Lymphocytes Relative: 11 %
Lymphs Abs: 1.2 10*3/uL (ref 0.7–4.0)
Monocytes Absolute: 1.1 10*3/uL — ABNORMAL HIGH (ref 0.1–1.0)
Monocytes Relative: 10 %
Neutro Abs: 8.7 10*3/uL — ABNORMAL HIGH (ref 1.7–7.7)
Neutrophils Relative %: 78 %

## 2021-08-13 LAB — COMPREHENSIVE METABOLIC PANEL
ALT: 13 U/L (ref 0–44)
AST: 23 U/L (ref 15–41)
Albumin: 3.5 g/dL (ref 3.5–5.0)
Alkaline Phosphatase: 67 U/L (ref 38–126)
Anion gap: 12 (ref 5–15)
BUN: 11 mg/dL (ref 8–23)
CO2: 18 mmol/L — ABNORMAL LOW (ref 22–32)
Calcium: 8.4 mg/dL — ABNORMAL LOW (ref 8.9–10.3)
Chloride: 109 mmol/L (ref 98–111)
Creatinine, Ser: 0.78 mg/dL (ref 0.44–1.00)
GFR, Estimated: >60 mL/min (ref >60)
Glucose, Bld: 86 mg/dL (ref 70–99)
Potassium: 3.9 mmol/L (ref 3.5–5.1)
Sodium: 139 mmol/L (ref 135–145)
Total Bilirubin: 0.6 mg/dL (ref 0.3–1.2)
Total Protein: 6.5 g/dL (ref 6.5–8.1)

## 2021-08-13 LAB — PROTIME-INR
INR: 1.1 (ref 0.8–1.2)
Prothrombin Time: 14.1 seconds (ref 11.4–15.2)

## 2021-08-13 LAB — I-STAT CHEM 8, ED
BUN: 11 mg/dL (ref 8–23)
Calcium, Ion: 1.01 mmol/L — ABNORMAL LOW (ref 1.15–1.40)
Chloride: 110 mmol/L (ref 98–111)
Creatinine, Ser: 0.6 mg/dL (ref 0.44–1.00)
Glucose, Bld: 85 mg/dL (ref 70–99)
HCT: 41 % (ref 36.0–46.0)
Hemoglobin: 13.9 g/dL (ref 12.0–15.0)
Potassium: 3.8 mmol/L (ref 3.5–5.1)
Sodium: 139 mmol/L (ref 135–145)
TCO2: 20 mmol/L — ABNORMAL LOW (ref 22–32)

## 2021-08-13 LAB — URINALYSIS, ROUTINE W REFLEX MICROSCOPIC
Bilirubin Urine: NEGATIVE
Glucose, UA: NEGATIVE mg/dL
Ketones, ur: NEGATIVE mg/dL
Nitrite: POSITIVE — AB
Protein, ur: NEGATIVE mg/dL
Specific Gravity, Urine: 1.01 (ref 1.005–1.030)
pH: 6 (ref 5.0–8.0)

## 2021-08-13 LAB — CBC
HCT: 40.9 % (ref 36.0–46.0)
Hemoglobin: 13.6 g/dL (ref 12.0–15.0)
MCH: 32.7 pg (ref 26.0–34.0)
MCHC: 33.3 g/dL (ref 30.0–36.0)
MCV: 98.3 fL (ref 80.0–100.0)
Platelets: 231 10*3/uL (ref 150–400)
RBC: 4.16 MIL/uL (ref 3.87–5.11)
RDW: 12.7 % (ref 11.5–15.5)
WBC: 11.1 10*3/uL — ABNORMAL HIGH (ref 4.0–10.5)
nRBC: 0 % (ref 0.0–0.2)

## 2021-08-13 LAB — URINALYSIS, MICROSCOPIC (REFLEX)

## 2021-08-13 LAB — RAPID URINE DRUG SCREEN, HOSP PERFORMED
Amphetamines: NOT DETECTED
Barbiturates: NOT DETECTED
Benzodiazepines: NOT DETECTED
Cocaine: NOT DETECTED
Opiates: NOT DETECTED
Tetrahydrocannabinol: NOT DETECTED

## 2021-08-13 LAB — RESP PANEL BY RT-PCR (FLU A&B, COVID) ARPGX2
Influenza A by PCR: NEGATIVE
Influenza B by PCR: NEGATIVE
SARS Coronavirus 2 by RT PCR: NEGATIVE

## 2021-08-13 LAB — CBG MONITORING, ED: Glucose-Capillary: 76 mg/dL (ref 70–99)

## 2021-08-13 LAB — ETHANOL: Alcohol, Ethyl (B): <10 mg/dL (ref <10)

## 2021-08-13 MED ORDER — ONDANSETRON HCL 4 MG/2ML IJ SOLN: 4.0000 mg | Freq: Four times a day (QID) | INTRAMUSCULAR | Status: DC | PRN

## 2021-08-13 MED ORDER — SODIUM CHLORIDE 0.9 % IV SOLN
100.0000 mL/h | INTRAVENOUS | Status: DC
  Administered 2021-08-13 – 2021-08-15 (×5): 100 mL/h via INTRAVENOUS

## 2021-08-13 MED ORDER — ONDANSETRON HCL 4 MG PO TABS: 4.0000 mg | ORAL_TABLET | Freq: Four times a day (QID) | ORAL | Status: DC | PRN

## 2021-08-13 MED ORDER — IOHEXOL 350 MG/ML SOLN
80.0000 mL | Freq: Once | INTRAVENOUS | Status: AC | PRN
  Administered 2021-08-13: 80 mL via INTRAVENOUS

## 2021-08-13 MED ORDER — ACETAMINOPHEN 650 MG RE SUPP: 650.0000 mg | Freq: Four times a day (QID) | RECTAL | Status: DC | PRN

## 2021-08-13 MED ORDER — SODIUM CHLORIDE 0.9 % IV BOLUS
500.0000 mL | Freq: Once | INTRAVENOUS | Status: AC
  Administered 2021-08-13: 500 mL via INTRAVENOUS

## 2021-08-13 MED ORDER — SODIUM CHLORIDE 0.9 % IV SOLN
1.0000 g | INTRAVENOUS | Status: DC
  Administered 2021-08-14: 1 g via INTRAVENOUS
  Filled 2021-08-13: qty 10

## 2021-08-13 MED ORDER — ENOXAPARIN SODIUM 40 MG/0.4ML IJ SOSY
40.0000 mg | PREFILLED_SYRINGE | INTRAMUSCULAR | Status: DC
  Administered 2021-08-13 – 2021-08-14 (×2): 40 mg via SUBCUTANEOUS
  Filled 2021-08-13 (×2): qty 0.4

## 2021-08-13 MED ORDER — SODIUM CHLORIDE 0.9 % IV SOLN
1.0000 g | Freq: Once | INTRAVENOUS | Status: AC
  Administered 2021-08-13: 1 g via INTRAVENOUS
  Filled 2021-08-13: qty 10

## 2021-08-13 MED ORDER — ACETAMINOPHEN 325 MG PO TABS: 650.0000 mg | ORAL_TABLET | Freq: Four times a day (QID) | ORAL | Status: DC | PRN

## 2021-08-13 NOTE — ED Notes (Signed)
Patient transported to MRI 

## 2021-08-13 NOTE — ED Notes (Addendum)
Patient observed to have removed IV and O2 monitoring device. RN placed mitts on patient's hands for safety of patient. ?

## 2021-08-13 NOTE — H&P (Addendum)
?History and Physical  ? ? ?Patient: [name] [name] HKV:[id number] DOB: 05-02-1941 ?DOA: 08/13/2021 ?DOS: the patient was seen and examined on 08/13/2021 ?PCP: [name], MD  ?Patient coming from: Home ? ?Chief Complaint:  ?Chief Complaint  ?Patient presents with  ? Altered Mental Status  ? ?HPI: [name] [name] is a 81 y.o. female with medical history significant of GERD, memory issues. ? ?Pt presents to ED with increased falls, change in behavior, confusion. ? ?Symptoms onset today, LKW around MN. ? ?3 falls since yesterday.  Confused with slurred speech. ? ?Husband brings her to ER. ? ?Husband notes h/o similar symptoms with UTIs in past. ? ?H/o Vertigo, takes scopolamine patches PRN. ? ?In ED: ?Initially evaluated as a code stroke due to encephalopathy and concern for L sided weakness / neglect.  No LVO on CTA, CT head neg, and MRI brain is neg ruling out stroke. ? ?Dr. Amada Jupiter saw pt: thinks it may just be metabolic encephalopathy from UTI. ? ?  ?Review of Systems: As mentioned in the history of present illness. All other systems reviewed and are negative. ?Past Medical History:  ?Diagnosis Date  ? Arthritis   ? Dizziness   ? Esophageal stricture   ? Gastroesophageal reflux disease   ? Hiatal hernia   ? Hiatal hernia   ? Varicose veins   ? Vertigo   ? chronic with exacerbation  ? ?Past Surgical History:  ?Procedure Laterality Date  ? BLADDER SUSPENSION    ? x 2  ? ENDOVENOUS ABLATION SAPHENOUS VEIN W/ LASER Right 09-05-2013  ? right greater saphenous vein by Gretta Began MD  ? ENDOVENOUS ABLATION SAPHENOUS VEIN W/ LASER Left 10-18-2013  ? endovenous laser ablation left greater saphenous vein by Gretta Began MD  ? VAGINAL HYSTERECTOMY    ? ?Social History:  reports that she has never smoked. She has never used smokeless tobacco. She reports that she does not drink alcohol and does not use drugs. ? ?Allergies  ?Allergen Reactions  ? Streptomycin   ? ? ?Family History  ?Problem Relation Age of Onset  ? Breast cancer  Sister   ? Kidney failure Father   ? Diabetes Father   ? Heart attack Sister   ?     was also a smoker  ? Colon cancer Other   ?     niece  ? ? ?Prior to Admission medications   ?Medication Sig Start Date End Date Taking? Authorizing Provider  ?COVID-19 mRNA bivalent vaccine, Pfizer, (PFIZER COVID-19 VAC BIVALENT) injection Inject into the muscle. 03/19/21     ?COVID-19 mRNA Vac-TriS, Pfizer, (PFIZER-BIONT COVID-19 VAC-TRIS) SUSP injection Inject into the muscle. 09/23/20   Judyann Munson, MD  ?donepezil (ARICEPT) 5 MG tablet Take 5 mg by mouth at bedtime.    [provider]  ?Multiple Vitamin (MULTIVITAMIN) tablet Take 1 tablet by mouth daily.    [provider]  ?TRANSDERM-SCOP 1.5 MG  04/23/13   [provider]  ? ? ?Physical Exam: ?Vitals:  ? 08/13/21 1500 08/13/21 1630 08/13/21 1700 08/13/21 1845  ?BP: 112/61 (!) 131/114 117/67 124/60  ?Pulse: 73 74 73 71  ?Resp: 19 18 18  (!) 22  ?Temp:      ?TempSrc:      ?SpO2: 99% 99% 98% 99%  ? ?Constitutional: NAD, calm, comfortable ?Eyes: PERRL, lids and conjunctivae normal ?ENMT: Mucous membranes are moist. Posterior pharynx clear of any exudate or lesions.Normal dentition.  ?Neck: normal, supple, no masses, no thyromegaly ?Respiratory: clear to auscultation  bilaterally, no wheezing, no crackles. Normal respiratory effort. No accessory muscle use.  ?Cardiovascular: Regular rate and rhythm, no murmurs / rubs / gallops. No extremity edema. 2+ pedal pulses. No carotid bruits.  ?Abdomen: no tenderness, no masses palpated. No hepatosplenomegaly. Bowel sounds positive.  ?Musculoskeletal: no clubbing / cyanosis. No joint deformity upper and lower extremities. Good ROM, no contractures. Normal muscle tone.  ?Skin: no rashes, lesions, ulcers. No induration ?Neurologic: Slightly slurred speech.  4/5 strength in all extremities. ?Psychiatric: Confused, oriented to self. ? ?Data Reviewed: ? ?UA suggestive of UTI. ?No LVO on CTA, CT head neg, and MRI brain  is neg ?CBC ?   ?Component Value Date/Time  ? WBC 11.1 (H) 08/13/2021 1509  ? RBC 4.16 08/13/2021 1509  ? HGB 13.9 08/13/2021 1534  ? HCT 41.0 08/13/2021 1534  ? PLT 231 08/13/2021 1509  ? MCV 98.3 08/13/2021 1509  ? MCH 32.7 08/13/2021 1509  ? MCHC 33.3 08/13/2021 1509  ? RDW 12.7 08/13/2021 1509  ? LYMPHSABS 1.2 08/13/2021 1509  ? MONOABS 1.1 (H) 08/13/2021 1509  ? EOSABS 0.0 08/13/2021 1509  ? BASOSABS 0.1 08/13/2021 1509  ? ?CMP  ?   ?Component Value Date/Time  ? NA 139 08/13/2021 1534  ? NA 139 04/10/2020 1046  ? K 3.8 08/13/2021 1534  ? CL 110 08/13/2021 1534  ? CO2 18 (L) 08/13/2021 1509  ? GLUCOSE 85 08/13/2021 1534  ? BUN 11 08/13/2021 1534  ? BUN 12 04/10/2020 1046  ? CREATININE 0.60 08/13/2021 1534  ? CALCIUM 8.4 (L) 08/13/2021 1509  ? PROT 6.5 08/13/2021 1509  ? PROT 7.1 04/10/2020 1046  ? ALBUMIN 3.5 08/13/2021 1509  ? ALBUMIN 4.1 04/10/2020 1046  ? AST 23 08/13/2021 1509  ? ALT 13 08/13/2021 1509  ? ALKPHOS 67 08/13/2021 1509  ? BILITOT 0.6 08/13/2021 1509  ? BILITOT 0.5 04/10/2020 1046  ? GFRNONAA >60 08/13/2021 1509  ? GFRAA 84 04/10/2020 1046  ? ? ? ? ?Assessment and Plan: ?* Acute metabolic encephalopathy ?Due to UTI, h/o same with UTIs per husband. ?No evidence of stroke on workup / MRI ?Plan = treat UTI ? ?UTI (urinary tract infection) ?Rocephin ?UCx ordered ? 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 ?Continue aricept ? ? ? ? ? Advance Care Planning:   Code Status: Full Code ? ?Consults: Neurology saw pt in ED ? ?Family Communication: Husband at bedside ? ?Severity of Illness: ?The appropriate patient status for this patient is OBSERVATION. Observation status is judged to be reasonable and necessary in order to provide the required intensity of service to ensure the patient's safety. The patient's presenting symptoms, physical exam findings, and initial radiographic and laboratory data in the context of their medical condition is felt to place them at decreased risk  for further clinical deterioration. Furthermore, it is anticipated that the patient will be medically stable for discharge from the hospital within 2 midnights of admission.  ? ?Author: ?Hillary Bow., DO ?08/13/2021 7:31 PM ? ?For on call review [email or web address].  ?

## 2021-08-13 NOTE — Assessment & Plan Note (Signed)
Due to UTI, h/o same with UTIs per husband. ?1. No evidence of stroke on workup / MRI ?2. Plan = treat UTI ?

## 2021-08-13 NOTE — Code Documentation (Addendum)
Stroke Response Nurse Documentation ?Code Documentation ? ?DEBE ANFINSON is a 81 y.o. female arriving to [facility]  via [facility] on 08/13/21 with past medical hx of arthritis, vertigo, GERD, esophageal stricture. On No antithrombotic. Code stroke was activated by ED.  ? ?Patient from doctor's office where she was LKW at midnight on 08/12/21 and now complaining of AMS, left sided weakness, slurred speech. Patient has fallen 3 times in the past 24 hours and per husband has been confused for a few days. She was at the doctor's office and they recommended she come to the hospital. EMS brought pt to [facility]. Code stroke activated when pt in CT by EDP.  ? ?Stroke team at the bedside on patient arrival. Labs drawn and patient cleared for CT by Dr. Lockie Mola. Patient to CT with team. NIHSS 2, see documentation for details and code stroke times. Patient with disoriented and dysarthria  on exam. The following imaging was completed:  CT Head and CTA. Patient is not a candidate for IV Thrombolytic due to outside window. Patient is not not a candidate for IR due to no LVO on scan per MD. Pt was able to ambulate with minimal assistance in and out of the bathroom and back to her room in the ED.  ? ?Care Plan: Q2x12 then Q4 neuro checks/vitals, MRI.  ? ?Bedside handoff with ED RN Paden.   ? ?Marquiz Sotelo, [place] Scrape  ?Stroke Response RN ? ? ?

## 2021-08-13 NOTE — Assessment & Plan Note (Signed)
1. Rocephin ?2. UCx ordered ?

## 2021-08-13 NOTE — Consult Note (Addendum)
Neurology Consultation ? ?Reason for Consult: Code stroke  ?Referring Physician: Dr. Lockie Mola ? ?CC: Left side weakness and AMS ? ?History is obtained from:medical record and husband at bedside ? ?HPI: [name] [name] is a 81 y.o. female with past medical history of vertigo, memory issues and GERD who presents to the [facility] Ed via EMS today for evaluation of different behavior and increased falls. Code stroke was called. Per husband she was LKW around midnight, he states she was having balance issues with a total of 3 falls since yesterday and confused with slurred speech  where she could not get her clothes on correctly. CT head no acute process. CTA head NO LVO. She has a scopolamine patch on that we have removed.  ? ?LKW: 08/12/2021 ?tpa given?: no, outside window  ?Premorbid modified Rankin scale (mRS):  ?2-Slight disability-UNABLE to perform all activities but does not need assistance  ? ? ?ROS:  Unable to obtain due to altered mental status.  ? ?Past Medical History:  ?Diagnosis Date  ? Arthritis   ? Dizziness   ? Esophageal stricture   ? Gastroesophageal reflux disease   ? Hiatal hernia   ? Hiatal hernia   ? Varicose veins   ? Vertigo   ? chronic with exacerbation  ? ? ? ?Family History  ?Problem Relation Age of Onset  ? Breast cancer Sister   ? Kidney failure Father   ? Diabetes Father   ? Heart attack Sister   ?     was also a smoker  ? Colon cancer Other   ?     niece  ? ? ? ?Social History:  ? reports that she has never smoked. She has never used smokeless tobacco. She reports that she does not drink alcohol and does not use drugs. ? ?Medications ? ?Current Facility-Administered Medications:  ?  sodium chloride 0.9 % bolus 500 mL, 500 mL, Intravenous, Once **FOLLOWED BY** 0.9 %  sodium chloride infusion, 100 mL/hr, Intravenous, Continuous, Linwood Dibbles, MD ? ?Current Outpatient Medications:  ?  COVID-19 mRNA bivalent vaccine, Pfizer, (PFIZER COVID-19 VAC BIVALENT) injection, Inject into the muscle., Disp: 0.3 mL,  Rfl: 0 ?  COVID-19 mRNA Vac-TriS, Pfizer, (PFIZER-BIONT COVID-19 VAC-TRIS) SUSP injection, Inject into the muscle., Disp: 0.3 mL, Rfl: 0 ?  donepezil (ARICEPT) 5 MG tablet, Take 5 mg by mouth at bedtime., Disp: , Rfl:  ?  Multiple Vitamin (MULTIVITAMIN) tablet, Take 1 tablet by mouth daily., Disp: , Rfl:  ?  TRANSDERM-SCOP 1.5 MG, , Disp: , Rfl:  ? ? ?Exam: ?Current vital signs: ?BP 112/61   Pulse 73   Temp 97.8 ?F (36.6 ?C) (Oral)   Resp 19   SpO2 99%  ?Vital signs in last 24 hours: ?Temp:  [97.8 ?F (36.6 ?C)] 97.8 ?F (36.6 ?C) (03/16 1443) ?Pulse Rate:  [73-79] 73 (03/16 1500) ?Resp:  [19-26] 19 (03/16 1500) ?BP: (112-118)/(51-61) 112/61 (03/16 1500) ?SpO2:  [99 %] 99 % (03/16 1500) ? ?GENERAL: Awake, alert in NAD ?HEENT: - Normocephalic and atraumatic, dry mm ?LUNGS - Clear to auscultation bilaterally with no wheezes ?CV - S1S2 RRR, no m/r/g, equal pulses bilaterally. ?ABDOMEN - Soft, nontender, nondistended with normoactive BS ?Ext: warm, well perfused, intact peripheral pulses, no edema ? ?NEURO:  ?Mental Status: She is confused able to state her name ?Language: speech is dysarthric.  Naming, repetition, fluency, and comprehension intact. ?Cranial Nerves: PERRL 2 mm/brisk. EOMI, visual fields full, no facial asymmetry, facial sensation intact, hearing intact, tongue/uvula/soft palate midline,  normal sternocleidomastoid and trapezius muscle strength. No evidence of tongue atrophy or fibrillations ?Motor: 4/5 in all 4 extremities  ?Tone: is normal and bulk is normal ?Sensation- Intact to light touch bilaterally ?Coordination: FTN intact bilaterally, no ataxia in BLE. ?Gait- deferred ? ?NIHSS ?1a Level of Conscious.: 0 ?1b LOC Questions: 1 ?1c LOC Commands: 0 ?2 Best Gaze: 0 ?3 Visual: 0 ?4 Facial Palsy: 0 ?5a Motor Arm - left: 0 ?5b Motor Arm - Right: 0 ?6a Motor Leg - Left: 0 ?6b Motor Leg - Right: 0 ?7 Limb Ataxia: 0 ?8 Sensory: 0 ?9 Best Language: 0 ?10 Dysarthria: 1 ?11 Extinct. and Inatten.: 0 ?TOTAL: 2   ? ?Labs ?I have reviewed labs in epic and the results pertinent to this consultation are: ? ? ?CBC ?   ?Component Value Date/Time  ? WBC 11.1 (H) 08/13/2021 1509  ? RBC 4.16 08/13/2021 1509  ? HGB 13.9 08/13/2021 1534  ? HCT 41.0 08/13/2021 1534  ? PLT 231 08/13/2021 1509  ? MCV 98.3 08/13/2021 1509  ? MCH 32.7 08/13/2021 1509  ? MCHC 33.3 08/13/2021 1509  ? RDW 12.7 08/13/2021 1509  ? LYMPHSABS 1.2 08/13/2021 1509  ? MONOABS 1.1 (H) 08/13/2021 1509  ? EOSABS 0.0 08/13/2021 1509  ? BASOSABS 0.1 08/13/2021 1509  ? ? ?CMP  ?   ?Component Value Date/Time  ? NA 139 08/13/2021 1534  ? NA 139 04/10/2020 1046  ? K 3.8 08/13/2021 1534  ? CL 110 08/13/2021 1534  ? CO2 23 04/10/2020 1046  ? GLUCOSE 85 08/13/2021 1534  ? BUN 11 08/13/2021 1534  ? BUN 12 04/10/2020 1046  ? CREATININE 0.60 08/13/2021 1534  ? CALCIUM 9.2 04/10/2020 1046  ? PROT 7.1 04/10/2020 1046  ? ALBUMIN 4.1 04/10/2020 1046  ? AST 18 04/10/2020 1046  ? ALT 12 04/10/2020 1046  ? ALKPHOS 90 04/10/2020 1046  ? BILITOT 0.5 04/10/2020 1046  ? GFRNONAA 73 04/10/2020 1046  ? GFRAA 84 04/10/2020 1046  ? ? ?Lipid Panel  ?   ?Component Value Date/Time  ? CHOL 213 (H) 06/07/2011 1628  ? TRIG 97.0 06/07/2011 1628  ? HDL 54.50 06/07/2011 1628  ? CHOLHDL 4 06/07/2011 1628  ? VLDL 19.4 06/07/2011 1628  ? LDLDIRECT 137.0 06/07/2011 1628  ? ? ? ?Imaging ?I have reviewed the images obtained: ? ?Code stroke CT-head 3/16: ?No evidence of acute intracranial abnormality. ASPECTS of 10 ? ?CTA H/N 3/16: ?1. No large vessel occlusion. ?2. Mild atherosclerosis in the head and neck without a significant proximal stenosis. ?3. 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 ?4. Mild focal dilatation of the right V3 segment, indeterminate though could reflect localized changes of fibromuscular dysplasia or the sequelae of an old healed dissection with possible small pseudoaneurysm. ?5. Aortic Atherosclerosis  ? ?MRI examination of the brain- ordered ? ?- [name] Mart DNP,  ACNPC-AG ? ?Assessment: ?[name] [name] is a 81 y.o. female with past medical history of vertigo, memory issues and GERD who presents to the [facility] Ed via EMS today for evaluation of different behavior and increased falls. Code stroke was called. Per husband she was LKW around midnight, he states she was having balance issues with a total of 3 falls since yesterday and confused with slurred speech  where she could not get her clothes on correctly.  ? ?It is certainly possible that this represents a right hemispheric posterior quadrant stroke, but I think it is also possible this simply represents metabolic encephalopathy ? ?  Recommendations: ?- MRI of the brain without contrast ?-Stroke work-up only if positive. ? ? ?I have seen the patient and reviewed the above note.  On my exam, there is no clearly focal findings, but I am concerned enough that I think that she needs further evaluation with MRI.  It is possible that this represents purely metabolic encephalopathy such as can be seen with urinary tract infection given the husband's report of similar symptoms in the past, but with the degree of unsteadiness she is exhibiting I think an MRI would be reasonable. ? ?If MRI is negative, then I would focus on treating her urinary tract infection and neurology will be available on an as-needed basis. ? ?Ritta SlotMcNeill Deeksha Cotrell, MD ?[facility] ?[phone] ? ?If 7pm- 7am, please page neurology on call as listed in AMION. ? ? ? ?

## 2021-08-13 NOTE — Assessment & Plan Note (Signed)
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 ?Continue aricept ?

## 2021-08-13 NOTE — Progress Notes (Signed)
Responded to page to support patient who had experienced a fall.  Upon arrival to support pt. Pt was gone to CT for scam.  Talked with husband who indicated that patient was ok  and they would  have Chaplain paged if needed. ? ?Fae Pippin, [facility], Pager [phone]   ?

## 2021-08-13 NOTE — Progress Notes (Signed)
? ? ? ?08/18/2021 ?[name] [name] ?[id number] ?August 05, 1940 ? ? ?ASSESSMENT AND PLAN:  ?Esophageal dysphagia with history of stricture and hiatal hernia ?11/24/2015 EGD benign stricture at GE junction dilated to 14, small Hiatal hernia ?08/08/2012 EGD benign stricture at GE junction dilated to 15, moderate hiatal hernia, cameron erosions ? EGD to evaluate for structural abnormality, tumor, erosive/infectious esophagititis, and EOE.   ?If the EGD is negative can then proceed to barium swallow ?Can do trial of PPI in the mean time, protonix 40 mg daily ?Discussed food impaction and reasons to go to the ER ?I discussed risks of EGD with patient today, including risk of sedation, bleeding or perforation.  ?Patient provides understanding and gave verbal consent to proceed. ? ?ESOPHAGEAL STRICTURE ?Diaphragmatic hernia without obstruction and without gangrene ? ?Vertigo ?Unremarkable CTA 07/2021 ?Going to vertigo PT ? ?Patient Care Team: ?[name] Blamer, MD as PCP - General (Family Medicine) ? ?HISTORY OF PRESENT ILLNESS: ?81 y.o. female referred by [name] Blamer, MD, with a past medical history of hiatal hernia, esophageal stricture, GERD and others listed below presents for evaluation of dysphagia.  ?Known to Dr. Russella Dar.  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 Had UTI/dehydration, on ABX.  ?Going to vertigo PT.  ? ?11/24/2015 EGD benign stricture at GE junction dilated to 14, small Hiatal hernia ?08/08/2012 EGD benign stricture at GE junction dilated to 15, moderate hiatal hernia, cameron erosions ?Labs 08/13/2021 without anemia, normal kidney, liver, normla INR.  ?She has never smoked.  ? ?[name] [name], her husband is with her.  ?Husband states it is happening more frequently in last 3-6 months. Has had to do heimlich maneuver in last month.  ?Worse with meats, chickens, bar.  ?No issues with fluids.  ?No weight loss.  ?She has minor GERD with certain foods, suppose to be on  GERD but only took for one month and then stopped.  ?No AB pain, no melena.  ? ? ?Current Medications:  ? ? ? ? ?Current Outpatient Medications (Analgesics):  ?  acetaminophen (TYLENOL) 500 MG tablet, Take 1,000 mg by mouth every 6 (six) hours as needed for moderate pain or headache. ? ? ?Current Outpatient Medications (Other):  ?  cefadroxil (DURICEF) 500 MG capsule, 2 capsules ?  OVER THE COUNTER MEDICATION, Take 3 capsules by mouth in the morning and at bedtime. Balance of nature ?  TRANSDERM-SCOP 1.5 MG, Place 1 patch onto the skin See admin instructions. 1 patch every 3 days as needed for dizziness ? ?Medical History:  ?Past Medical History:  ?Diagnosis Date  ? Arthritis   ? Dizziness   ? Esophageal stricture   ? Gastroesophageal reflux disease   ? Hiatal hernia   ? Hiatal hernia   ? Varicose veins   ? Vertigo   ? chronic with exacerbation  ? ?Allergies:  ?Allergies  ?Allergen Reactions  ? Streptomycin   ?  ? ?Surgical History:  ?She  has a past surgical history that includes Vaginal hysterectomy; Bladder suspension; Endovenous ablation saphenous vein w/ laser (Right, 09-05-2013); and Endovenous ablation saphenous vein w/ laser (Left, 10-18-2013). ?Family History:  ?Her family history includes Breast cancer in her sister; Colon cancer in an other family member; Diabetes in her father; Heart attack in her sister; Kidney failure in her father. ?Social History:  ? reports that she has never smoked. She has never used smokeless tobacco. She reports that she does not drink alcohol and does not use drugs. ? ?REVIEW OF SYSTEMS  :  All other systems reviewed and negative except where noted in the History of Present Illness. ? ? ?PHYSICAL EXAM: ?BP 130/80   Pulse 70   Ht 5\' 3"  (1.6 m)   Wt 143 lb (64.9 kg)   SpO2 97%   BMI 25.33 kg/m?  ?General:   Pleasant, well developed female in no acute distress ?Head:  Normocephalic and atraumatic. ?Eyes: sclerae anicteric,conjunctive pink  ?Heart:  regular rate and  rhythm ?Pulm: Clear anteriorly; no wheezing ?Abdomen:  Soft, Flat AB, skin exam normal, Normal bowel sounds.  no  tenderness . Without guarding and Without rebound, without hepatomegaly. ?Extremities:  Without edema. ?Msk:  Symmetrical without gross deformities. Peripheral pulses intact.  ?Neurologic:  Alert and  oriented x4;  grossly normal neurologically. ?Skin:   Dry and intact without significant lesions or rashes. ?Psychiatric: Demonstrates good judgement and reason without abnormal affect or behaviors. ? ? ? , PA-C ?11:22 AM ? ? ?

## 2021-08-13 NOTE — ED Triage Notes (Signed)
Pt BIB [facility] from dr office after husband reports AMS since 1200 yesterday. EMS reports patient had two falls yesterday and one today and increasing trouble with ADLs. Patient able to ambulate to stretcher with assistance. Family reports issues with speech normally, patient behavior is different today. ?

## 2021-08-13 NOTE — ED Provider Notes (Signed)
?[facility] ?Provider Note ? ? ?CSN: [id number] ?Arrival date & time: 08/13/21  1425 ? ?An emergency department physician performed an initial assessment on this suspected stroke patient at 1540. ? ?History ? ?Chief Complaint  ?Patient presents with  ? Altered Mental Status  ? ? ?[name] [name] is a 81 y.o. female. ? ?Last known normal midnight last night, balance issues, speech problems, confusion at times.  History of urinary tract infections that cause the same.  Denies any chest pain or shortness of breath.  Husband states that she has been off balance.  Has a history of vertigo.  Take scopolamine patches at times.  She denies any dizziness or headaches.  He has noticed slurred speech and confused speech at times.  No obvious weakness or vision changes. ? ?The history is provided by the patient.  ?Altered Mental Status ?Presenting symptoms: confusion   ?Severity:  Mild ?Most recent episode:  Yesterday (last known normal midnight last night (16 hours ago).) ?Episode history:  Continuous ?Timing:  Constant ?Progression:  Unchanged ?Chronicity:  New ?Context: not dementia   ?Associated symptoms: slurred speech   ?Associated symptoms: no abdominal pain, normal movement, no agitation, no bladder incontinence, no decreased appetite, no depression, no difficulty breathing, no eye deviation, no fever, no hallucinations, no headaches, no light-headedness, no nausea, no palpitations, no visual change, no vomiting and no weakness   ? ?  ? ?Home Medications ?Prior to Admission medications   ?Medication Sig Start Date End Date Taking? Authorizing Provider  ?acetaminophen (TYLENOL) 500 MG tablet Take 1,000 mg by mouth every 6 (six) hours as needed for moderate pain or headache.   Yes [provider]  ?OVER THE COUNTER MEDICATION Take 3 capsules by mouth in the morning and at bedtime. Balance of nature   Yes [provider]  ?TRANSDERM-SCOP 1.5 MG Place 1 patch onto the  skin See admin instructions. 1 patch every 3 days as needed for dizziness 04/23/13  Yes [provider]  ?   ? ?Allergies    ?Streptomycin   ? ?Review of Systems   ?Review of Systems  ?Constitutional:  Negative for decreased appetite and fever.  ?Cardiovascular:  Negative for palpitations.  ?Gastrointestinal:  Negative for abdominal pain, nausea and vomiting.  ?Genitourinary:  Negative for bladder incontinence.  ?Neurological:  Negative for weakness, light-headedness and headaches.  ?Psychiatric/Behavioral:  Positive for confusion. Negative for agitation and hallucinations.   ? ?Physical Exam ?Updated Vital Signs ?BP (!) 107/51 (BP Location: Left Arm)   Pulse 71   Temp 98.7 ?F (37.1 ?C) (Oral)   Resp 19   SpO2 100%  ?Physical Exam ?Vitals and nursing note reviewed.  ?Constitutional:   ?   General: She is not in acute distress. ?   Appearance: She is well-developed. She is not ill-appearing.  ?HENT:  ?   Head: Normocephalic and atraumatic.  ?   Nose: Nose normal.  ?   Mouth/Throat:  ?   Mouth: Mucous membranes are moist.  ?Eyes:  ?   Extraocular Movements: Extraocular movements intact.  ?   Conjunctiva/sclera: Conjunctivae normal.  ?   Pupils: Pupils are equal, round, and reactive to light.  ?Cardiovascular:  ?   Rate and Rhythm: Normal rate and regular rhythm.  ?   Pulses: Normal pulses.  ?   Heart sounds: Normal heart sounds. No murmur heard. ?Pulmonary:  ?   Effort: Pulmonary effort is normal. No respiratory distress.  ?   Breath sounds:  Normal breath sounds.  ?Abdominal:  ?   General: Abdomen is flat.  ?   Palpations: Abdomen is soft.  ?   Tenderness: There is no abdominal tenderness.  ?Musculoskeletal:  ?   Cervical back: Normal range of motion and neck supple.  ?Skin: ?   General: Skin is warm and dry.  ?   Capillary Refill: Capillary refill takes less than 2 seconds.  ?Neurological:  ?   General: No focal deficit present.  ?   Mental Status: She is alert and oriented to person, place, and time.  ?    Sensory: No sensory deficit.  ?   Motor: Weakness present.  ?   Comments: Slightly slurred speech, may be trace weakness in the left upper extremity but 5+ out of 5 strength throughout, normal sensation, possibly left-sided neglect on exam, slowed finger-nose-finger on the left upper extremity, no obvious drift, visual fields are intact  ?Psychiatric:     ?   Mood and Affect: Mood normal.  ? ? ?ED Results / Procedures / Treatments   ?Labs ?(all labs ordered are listed, but only abnormal results are displayed) ?Labs Reviewed  ?COMPREHENSIVE METABOLIC PANEL - Abnormal; Notable for the following components:  ?    Result Value  ? CO2 18 (*)   ? Calcium 8.4 (*)   ? All other components within normal limits  ?CBC - Abnormal; Notable for the following components:  ? WBC 11.1 (*)   ? All other components within normal limits  ?DIFFERENTIAL - Abnormal; Notable for the following components:  ? Neutro Abs 8.7 (*)   ? Monocytes Absolute 1.1 (*)   ? All other components within normal limits  ?URINALYSIS, ROUTINE W REFLEX MICROSCOPIC - Abnormal; Notable for the following components:  ? APPearance HAZY (*)   ? Hgb urine dipstick TRACE (*)   ? Nitrite POSITIVE (*)   ? Leukocytes,Ua MODERATE (*)   ? All other components within normal limits  ?URINALYSIS, MICROSCOPIC (REFLEX) - Abnormal; Notable for the following components:  ? Bacteria, UA MANY (*)   ? All other components within normal limits  ?I-STAT CHEM 8, ED - Abnormal; Notable for the following components:  ? Calcium, Ion 1.01 (*)   ? TCO2 20 (*)   ? All other components within normal limits  ?RESP PANEL BY RT-PCR (FLU A&B, COVID) ARPGX2  ?URINE CULTURE  ?ETHANOL  ?PROTIME-INR  ?APTT  ?RAPID URINE DRUG SCREEN, HOSP PERFORMED  ?CBC  ?BASIC METABOLIC PANEL  ?CBG MONITORING, ED  ?CBG MONITORING, ED  ? ? ?EKG ?EKG Interpretation ? ?Date/Time:  Thursday August 13 2021 14:43:28 EDT ?Ventricular Rate:  87 ?PR Interval:  138 ?QRS Duration: 79 ?QT Interval:  378 ?QTC  Calculation: 455 ?R Axis:   -13 ?Text Interpretation: Sinus rhythm Probable left atrial enlargement Borderline T abnormalities, lateral leads Baseline wander in lead(s) V1 No significant change since last tracing Confirmed by Linwood Dibbles [phone]) on 08/13/2021 2:54:32 PM ? ?Radiology ?MR BRAIN WO CONTRAST ? ?Result Date: 08/13/2021 ?CLINICAL DATA:  Left-sided neglect, stroke suspected EXAM: MRI HEAD WITHOUT CONTRAST TECHNIQUE: Multiplanar, multiecho pulse sequences of the brain and surrounding structures were obtained without intravenous contrast. COMPARISON:  05/02/2020 MRI head, correlation is also made with 08/13/2021 CT head and CTA head neck FINDINGS: Evaluation is somewhat limited by motion artifact. Brain: No restricted diffusion to suggest acute or subacute infarct. No acute hemorrhage, mass, mass effect, or midline shift. No hydrocephalus or extra-axial collection. Scattered T2 hyperintense signal in the periventricular  white matter, likely the sequela of mild chronic small vessel ischemic disease. Vascular: Normal flow voids. Skull and upper cervical spine: Normal marrow signal. Sinuses/Orbits: Negative. Other: The mastoids are well aerated. IMPRESSION: Evaluation is somewhat limited by motion artifact. Within this limitation, no acute intracranial process. Electronically Signed   By: Wiliam Ke M.D.   On: 08/13/2021 19:03  ? ?DG Chest Portable 1 View ? ?Result Date: 08/13/2021 ?CLINICAL DATA:  Altered mental status.  Code stroke.  Confusion. EXAM: PORTABLE CHEST 1 VIEW COMPARISON:  None. FINDINGS: Cardiac silhouette and mediastinal contours are within normal limits with calcification seen within the aortic arch. The lungs are clear. No pleural effusion or pneumothorax. Mild levocurvature of the lower thoracic spine. Mild multilevel degenerative disc changes. IMPRESSION: No acute cardiopulmonary disease process. Electronically Signed   By: Neita Garnet M.D.   On: 08/13/2021 16:13  ? ?CT HEAD CODE STROKE WO  CONTRAST` ? ?Result Date: 08/13/2021 ?CLINICAL DATA:  Code stroke. Neuro deficit, acute, stroke suspected. Left-sided neglect. EXAM: CT HEAD WITHOUT CONTRAST TECHNIQUE: Contiguous axial images were obtained from the base of

## 2021-08-13 NOTE — ED Provider Triage Note (Signed)
Emergency Medicine Provider Triage Evaluation Note ? ?[name] [name] , a 81 y.o. female   denies any specific complaints.  Per EMS report patient went to her doctor's office today.  They noted that the patient has been more confused since noon yesterday.  Apparently she had 2 falls and trouble with ADLs.  Patient was sent from the doctor's office to the ED for further evaluation.  Family is not currently at the bedside.  Patient herself denies any complaints.  She is not exactly sure why she is here ? ?Review of Systems  ?Positive:  ?Negative: Patient denies any complaints ? ?Physical Exam  ?Temp 97.8 ?F (36.6 ?C) (Oral)   SpO2 99%  ?Gen:   Awake, no distress   ?Resp:  Normal effort  ?MSK:   Moves extremities without difficulty  ?Other:  No facial droop, patient is pleasant talkative, no difficulty lifting her arms or legs ? ?Medical Decision Making  ?Medically screening exam initiated at 2:50 PM.  Appropriate orders placed.  [name] [name] was informed that the remainder of the evaluation will be completed by another provider, this initial triage assessment does not replace that evaluation, and the importance of remaining in the ED until their evaluation is complete. ? ? ?  ?Linwood Dibbles, MD ?08/13/21 1452 ? ?

## 2021-08-14 DIAGNOSIS — N390 Urinary tract infection, site not specified: Secondary | ICD-10-CM | POA: Diagnosis present

## 2021-08-14 DIAGNOSIS — Z20822 Contact with and (suspected) exposure to covid-19: Secondary | ICD-10-CM | POA: Diagnosis present

## 2021-08-14 DIAGNOSIS — G9340 Encephalopathy, unspecified: Secondary | ICD-10-CM | POA: Diagnosis present

## 2021-08-14 DIAGNOSIS — Z841 Family history of disorders of kidney and ureter: Secondary | ICD-10-CM | POA: Diagnosis not present

## 2021-08-14 DIAGNOSIS — R5381 Other malaise: Secondary | ICD-10-CM | POA: Diagnosis present

## 2021-08-14 DIAGNOSIS — F0390 Unspecified dementia without behavioral disturbance: Secondary | ICD-10-CM | POA: Diagnosis present

## 2021-08-14 DIAGNOSIS — Z833 Family history of diabetes mellitus: Secondary | ICD-10-CM | POA: Diagnosis not present

## 2021-08-14 DIAGNOSIS — M1990 Unspecified osteoarthritis, unspecified site: Secondary | ICD-10-CM | POA: Diagnosis present

## 2021-08-14 DIAGNOSIS — Z87440 Personal history of urinary (tract) infections: Secondary | ICD-10-CM | POA: Diagnosis not present

## 2021-08-14 DIAGNOSIS — Z800 Family history of malignant neoplasm of digestive organs: Secondary | ICD-10-CM | POA: Diagnosis not present

## 2021-08-14 DIAGNOSIS — Z90710 Acquired absence of both cervix and uterus: Secondary | ICD-10-CM | POA: Diagnosis not present

## 2021-08-14 DIAGNOSIS — R410 Disorientation, unspecified: Secondary | ICD-10-CM

## 2021-08-14 DIAGNOSIS — G9341 Metabolic encephalopathy: Secondary | ICD-10-CM | POA: Diagnosis present

## 2021-08-14 DIAGNOSIS — B9620 Unspecified Escherichia coli [E. coli] as the cause of diseases classified elsewhere: Secondary | ICD-10-CM | POA: Diagnosis present

## 2021-08-14 DIAGNOSIS — Z803 Family history of malignant neoplasm of breast: Secondary | ICD-10-CM | POA: Diagnosis not present

## 2021-08-14 DIAGNOSIS — Z8249 Family history of ischemic heart disease and other diseases of the circulatory system: Secondary | ICD-10-CM | POA: Diagnosis not present

## 2021-08-14 DIAGNOSIS — Z781 Physical restraint status: Secondary | ICD-10-CM | POA: Diagnosis not present

## 2021-08-14 DIAGNOSIS — R4781 Slurred speech: Secondary | ICD-10-CM | POA: Diagnosis present

## 2021-08-14 DIAGNOSIS — E872 Acidosis, unspecified: Secondary | ICD-10-CM | POA: Diagnosis present

## 2021-08-14 LAB — CBC
HCT: 35.6 % — ABNORMAL LOW (ref 36.0–46.0)
Hemoglobin: 11.7 g/dL — ABNORMAL LOW (ref 12.0–15.0)
MCH: 31.9 pg (ref 26.0–34.0)
MCHC: 32.9 g/dL (ref 30.0–36.0)
MCV: 97 fL (ref 80.0–100.0)
Platelets: 214 10*3/uL (ref 150–400)
RBC: 3.67 MIL/uL — ABNORMAL LOW (ref 3.87–5.11)
RDW: 12.8 % (ref 11.5–15.5)
WBC: 7.4 10*3/uL (ref 4.0–10.5)
nRBC: 0 % (ref 0.0–0.2)

## 2021-08-14 LAB — BASIC METABOLIC PANEL
Anion gap: 9 (ref 5–15)
BUN: 10 mg/dL (ref 8–23)
CO2: 20 mmol/L — ABNORMAL LOW (ref 22–32)
Calcium: 8.2 mg/dL — ABNORMAL LOW (ref 8.9–10.3)
Chloride: 110 mmol/L (ref 98–111)
Creatinine, Ser: 0.74 mg/dL (ref 0.44–1.00)
GFR, Estimated: >60 mL/min (ref >60)
Glucose, Bld: 78 mg/dL (ref 70–99)
Potassium: 3.7 mmol/L (ref 3.5–5.1)
Sodium: 139 mmol/L (ref 135–145)

## 2021-08-14 MED ORDER — QUETIAPINE FUMARATE 25 MG PO TABS
25.0000 mg | ORAL_TABLET | Freq: Once | ORAL | Status: AC | PRN
  Administered 2021-08-14: 25 mg via ORAL
  Filled 2021-08-14: qty 1

## 2021-08-14 MED ORDER — HALOPERIDOL LACTATE 5 MG/ML IJ SOLN: 2.0000 mg | Freq: Once | INTRAMUSCULAR | Status: DC | PRN

## 2021-08-14 NOTE — Progress Notes (Signed)
Physical Therapy Evaluation ?Patient Details ?Name: [name] [name] ?MRN: [id number] ?DOB: [birth year] ?Today's Date: 08/14/2021 ? ?History of Present Illness ? [name] [name] is a 82 y.o. female with medical history significant of GERD, memory issues. Pt presents to ED with increased falls, change in behavior, confusion. Her husband reports LKW around midnight. Pt outside window for tpa. CT head no acute process. MRI brain negative.  ?Clinical Impression ? Pt admitted with/for the problems listed above.  She has improved throughout this day, but still not at functional or cognitive baseline.  Pt needing min assist then min guard overall for basic mobility and gait. Marland Kitchen  Pt currently limited functionally due to the problems listed below.  (see problems list.)  Pt will benefit from PT to maximize function and safety to be able to get home safely with available assist. ?   ?   ? ?Recommendations for follow up therapy are one component of a multi-disciplinary discharge planning process, led by the attending physician.  Recommendations may be updated based on patient status, additional functional criteria and insurance authorization. ? ?Follow Up Recommendations Outpatient PT ? ?  ?Assistance Recommended at Discharge Intermittent Supervision/Assistance  ?Patient can return home with the following ? A little help with bathing/dressing/bathroom;Assistance with cooking/housework;Direct supervision/assist for medications management;Direct supervision/assist for financial management;Assist for transportation;Help with stairs or ramp for entrance ? ?  ?Equipment Recommendations None recommended by PT  ?Recommendations for Other Services ?    ?  ?Functional Status Assessment Patient has had a recent decline in their functional status and demonstrates the ability to make significant improvements in function in a reasonable and predictable amount of time.  ? ?  ?Precautions / Restrictions Precautions ?Precautions:  Fall ?Restrictions ?Weight Bearing Restrictions: No  ? ?  ? ?Mobility ? Bed Mobility ?Overal bed mobility: Needs Assistance ?Bed Mobility: Supine to Sit ?  ?  ?Supine to sit: Supervision ?  ?  ?General bed mobility comments: supervision for safety ?  ? ?Transfers ?Overall transfer level: Needs assistance ?Equipment used: Rolling walker (2 wheels), 1 person hand held assist ?Transfers: Sit to/from Stand ?Sit to Stand: Min guard, Min assist ?  ?  ?  ?  ?  ?General transfer comment: min initially, then min guard on sucessive sit to stands. ?  ? ?Ambulation/Gait ?Ambulation/Gait assistance: Min guard ?Gait Distance (Feet): 300 Feet ?Assistive device: None ?Gait Pattern/deviations: Step-through pattern ?Gait velocity: best speed is still slower and not age appropriate. ?Gait velocity interpretation: 1.31 - 2.62 ft/sec, indicative of limited community ambulator ?  ?General Gait Details: mildly unsteady with several deviations  (?vestibular issues)  but no overt LOB ? ?Stairs ?Stairs: Yes ?Stairs assistance: Min assist ?Stair Management: One rail Right, Step to pattern, Forwards ?Number of Stairs: 2 ?General stair comments: safest with assist and rail ? ?Wheelchair Mobility ?  ? ?Modified Rankin (Stroke Patients Only) ?  ? ?  ? ?Balance Overall balance assessment: Mild deficits observed, not formally tested ?  ?  ?  ?  ?  ?  ?  ?  ?  ?  ?  ?  ?  ?  ?  ?Standardized Balance Assessment ?Standardized Balance Assessment : Dynamic Gait Index ?  ?Dynamic Gait Index ?Level Surface: Mild Impairment ?Change in Gait Speed: Mild Impairment ?Gait with Horizontal Head Turns: Mild Impairment ?Gait with Vertical Head Turns: Mild Impairment ?Gait and Pivot Turn: Mild Impairment ?Step Over Obstacle: Mild Impairment ?Step Around Obstacles: Mild Impairment ?Steps: Mild Impairment ?Total Score: 16 ?   ? ? ? ?  Pertinent Vitals/Pain Pain Assessment ?Pain Assessment: No/denies pain  ? ? ?Home Living Family/patient expects to be discharged to::  Private residence ?Living Arrangements: Spouse/significant other ?Available Help at Discharge: Family;Available PRN/intermittently ?Type of Home: House ?Home Access: Stairs to enter ?  ?Entrance Stairs-Number of Steps: 1 ?  ?Home Layout: One level ?  ?Additional Comments: Pt is an unreliable historian 2/2 cognitive limitations, reports she and her husband are renting an apartment with 4steps to enter, with a walk-in shower with a shower seat.  ?  ?Prior Function Prior Level of Function : Independent/Modified Independent;History of Falls (last six months);Patient poor historian/Family not available (Independent in the home) ?  ?  ?  ?  ?  ?  ?Mobility Comments: pt usually does not use AD, usually not a fall ?ADLs Comments: pt reports she was independent with ADL/IADL. no family available/present to assist with gathering PLOF information ?  ? ? ?Hand Dominance  ? Dominant Hand: Right ? ?  ?Extremity/Trunk Assessment  ? Upper Extremity Assessment ?Upper Extremity Assessment: Overall WFL for tasks assessed ?  ? ?Lower Extremity Assessment ?Lower Extremity Assessment: Overall WFL for tasks assessed ?  ? ?Cervical / Trunk Assessment ?Cervical / Trunk Assessment: Normal  ?Communication  ? Communication: No difficulties  ?Cognition Arousal/Alertness: Awake/alert ?Behavior During Therapy: [facility] for tasks assessed/performed ?Overall Cognitive Status: No family/caregiver present to determine baseline cognitive functioning ?  ?  ?  ?  ?  ?  ?  ?  ?  ?  ?  ?  ?  ?  ?  ?  ?General Comments: Pt oriented to self, knew she was in a hospital, unable to provide reason for admission. She reported the month was september. required cues for safety awareness, sequencing, and cues for complete termination of grooming task. ?  ?  ? ?  ?General Comments General comments (skin integrity, edema, etc.): Pt/husband relay long-term issues with dizziness that sounds very vestibular in nature.  Pt will not progress though diagnostic and treatment  maneuvers that are noxious and as of yet has not been able to continue habituation exercises, if I'm understanding correctly. ? ?  ?Exercises    ? ?Assessment/Plan  ?  ?PT Assessment Patient needs continued PT services  ?PT Problem List Decreased activity tolerance;Decreased balance;Decreased mobility;Decreased safety awareness ? ?   ?  ?PT Treatment Interventions Gait training;Functional mobility training;Therapeutic activities   ? ?PT Goals (Current goals can be found in the Care Plan section)  ?Acute Rehab PT Goals ?Patient Stated Goal: we'd like to address the problem of dizziness (vestibular) ?PT Goal Formulation: With patient/family ?Time For Goal Achievement: 08/28/21 ?Potential to Achieve Goals: Fair ? ?  ?Frequency Min 3X/week ?  ? ? ?Co-evaluation   ?  ?  ?  ?  ? ? ?  ?AM-PAC PT "6 Clicks" Mobility  ?Outcome Measure Help needed turning from your back to your side while in a flat bed without using bedrails?: A Little ?Help needed moving from lying on your back to sitting on the side of a flat bed without using bedrails?: A Little ?Help needed moving to and from a bed to a chair (including a wheelchair)?: A Little ?Help needed standing up from a chair using your arms (e.g., wheelchair or bedside chair)?: A Little ?Help needed to walk in hospital room?: A Little ?Help needed climbing 3-5 steps with a railing? : A Little ?6 Click Score: 18 ? ?  ?End of Session   ?Activity Tolerance: Patient tolerated treatment  well ?Patient left: in chair;with call bell/phone within reach;with chair alarm set;with family/visitor present ?Nurse Communication: Mobility status ?PT Visit Diagnosis: Unsteadiness on feet (R26.81);Other (comment);Other symptoms and signs involving the nervous system (R29.898) (recent h/o falls, ? venstibular d/o) ?  ? ?Time: [id number] ?PT Time Calculation (min) (ACUTE ONLY): 24 min ? ? ?Charges:   PT Evaluation ?$PT Eval Moderate Complexity: 1 Mod ?PT Treatments ?$Neuromuscular Re-education: 8-22  mins ?  ?   ? ? ?08/14/2021 ? ?Jacinto HalimKen M., PT ?Acute Rehabilitation Services ?[phone]  (pager) ?[phone]  (office) ? ?Eliseo GumKenneth V Yamira Papa ?08/14/2021, 5:53 PM ?

## 2021-08-14 NOTE — Progress Notes (Signed)
? ?PROGRESS NOTE ? ?[name] [name] ZOX:[id number] DOB: [birth year] DOA: 08/13/2021 ?PCP: [name], MD ? ?HPI/Recap of past 24 hours: ?[name] [name] is a 81 y.o. female with medical history significant of GERD, memory issues.  Pt presents to ED with increased falls, change in behavior, and worsening confusion.  MRI brain negative for acute CVA.  Work-up revealed acute metabolic encephalopathy secondary to presumptive UTI.  UA positive for pyuria on admission.  Urine culture in process for ID and sensitivities.  Seen by neurology, nothing to add from a neurological standpoint. ? ?08/14/2021: Patient was seen and examined at bedside.  She is alert and confused.  For patient's safety one-to-one sitter was ordered and patient on soft restraints. ? ? ?Assessment/Plan: ?Principal Problem: ?  Acute metabolic encephalopathy ?Active Problems: ?  UTI (urinary tract infection) ?  Memory loss ? ?Acute metabolic encephalopathy secondary to presumptive UTI. ?MRI brain negative for acute CVA. ?Urine analysis positive for pyuria ?Follow urine culture for ID and sensitivities. ?Started on Rocephin empirically E. coli continue. ?Continue one-to-one sitter for patient's own safety. ?Continue fall precautions and aspiration precaution. ?Delirium precautions. ?  ?Presumptive UTI (urinary tract infection), POA ?Continue Rocephin ?Follow urine culture.  Stated above. ? ?Non-anion gap metabolic acidosis ?Serum bicarb trending 20 from 18 ?Continue gentle IV fluid hydration ?Encourage oral intake ?Liberalize diet ?  ?Memory loss ?Patient dementia ?Home Aricept ? ?Physical debility ?PT OT to assess ?Continue fall precaution ?  ?  ? ? ?Code Status: Full code ? ?Family Communication: Updated husband at bedside ? ?Disposition Plan: Likely discharge to home with home health services. ? ? ?Consultants: ?Neurology, signed off. ? ?Procedures: ?None ? ?Antimicrobials: ?Rocephin ? ?DVT prophylaxis: ?Subcu Lovenox daily. ? ?Status is:  Inpatient ? ?Patient requires at least 2 midnights for further evaluation and treatment of present condition. ? ? ? ?Objective: ?Vitals:  ? 08/14/21 0315 08/14/21 0840 08/14/21 1100 08/14/21 1551  ?BP: (!) 104/52 (!) 107/59 (!) 111/46 119/68  ?Pulse: 68 72 77 76  ?Resp: 19 16 14 16   ?Temp: 98 ?F (36.7 ?C) 97.7 ?F (36.5 ?C) 99.2 ?F (37.3 ?C) 98.3 ?F (36.8 ?C)  ?TempSrc: Oral Oral Oral Oral  ?SpO2: 97% 97% 97% 97%  ?Weight:      ?Height:      ? ? ?Intake/Output Summary (Last 24 hours) at 08/14/2021 1647 ?Last data filed at 08/14/2021 1100 ?Gross per 24 hour  ?Intake 712.65 ml  ?Output --  ?Net 712.65 ml  ? ?Filed Weights  ? 08/13/21 2307  ?Weight: 65.4 kg  ? ? ?Exam: ? ?General: 81 y.o. year-old female well developed well nourished in no acute distress.  Alert and confused. ?Cardiovascular: Regular rate and rhythm with no rubs or gallops.  No thyromegaly or JVD noted.   ?Respiratory: Clear to auscultation with no wheezes or rales.  Poor inspiratory effort. ?Abdomen: Soft nontender nondistended with normal bowel sounds x4 quadrants. ?Musculoskeletal: Trace lower extremity edema bilaterally. ?Skin: No ulcerative lesions noted or rashes, ?Psychiatry: Mood is appropriate for condition and setting ? ? ?Data Reviewed: ?CBC: ?Recent Labs  ?Lab 08/13/21 ?1509 08/13/21 ?1534 08/14/21 ?0500  ?WBC 11.1*  --  7.4  ?NEUTROABS 8.7*  --   --   ?HGB 13.6 13.9 11.7*  ?HCT 40.9 41.0 35.6*  ?MCV 98.3  --  97.0  ?PLT 231  --  214  ? ?Basic Metabolic Panel: ?Recent Labs  ?Lab 08/13/21 ?1509 08/13/21 ?1534 08/14/21 ?0500  ?NA 139 139 139  ?K 3.9 3.8  3.7  ?CL 109 110 110  ?CO2 18*  --  20*  ?GLUCOSE 86 85 78  ?BUN 11 11 10   ?CREATININE 0.78 0.60 0.74  ?CALCIUM 8.4*  --  8.2*  ? ?GFR: ?Estimated Creatinine Clearance: 49.3 mL/min (by C-G formula based on SCr of 0.74 mg/dL). ?Liver Function Tests: ?Recent Labs  ?Lab 08/13/21 ?1509  ?AST 23  ?ALT 13  ?ALKPHOS 67  ?BILITOT 0.6  ?PROT 6.5  ?ALBUMIN 3.5  ? ?No results for input(s): LIPASE, AMYLASE  in the last 168 hours. ?No results for input(s): AMMONIA in the last 168 hours. ?Coagulation Profile: ?Recent Labs  ?Lab 08/13/21 ?1509  ?INR 1.1  ? ?Cardiac Enzymes: ?No results for input(s): CKTOTAL, CKMB, CKMBINDEX, TROPONINI in the last 168 hours. ?BNP (last 3 results) ?No results for input(s): PROBNP in the last 8760 hours. ?HbA1C: ?No results for input(s): HGBA1C in the last 72 hours. ?CBG: ?Recent Labs  ?Lab 08/13/21 ?1436  ?GLUCAP 76  ? ?Lipid Profile: ?No results for input(s): CHOL, HDL, LDLCALC, TRIG, CHOLHDL, LDLDIRECT in the last 72 hours. ?Thyroid Function Tests: ?No results for input(s): TSH, T4TOTAL, FREET4, T3FREE, THYROIDAB in the last 72 hours. ?Anemia Panel: ?No results for input(s): VITAMINB12, FOLATE, FERRITIN, TIBC, IRON, RETICCTPCT in the last 72 hours. ?Urine analysis: ?   ?Component Value Date/Time  ? COLORURINE YELLOW 08/13/2021 1601  ? APPEARANCEUR HAZY (A) 08/13/2021 1601  ? LABSPEC 1.010 08/13/2021 1601  ? PHURINE 6.0 08/13/2021 1601  ? GLUCOSEU NEGATIVE 08/13/2021 1601  ? HGBUR TRACE (A) 08/13/2021 1601  ? BILIRUBINUR NEGATIVE 08/13/2021 1601  ? KETONESUR NEGATIVE 08/13/2021 1601  ? PROTEINUR NEGATIVE 08/13/2021 1601  ? NITRITE POSITIVE (A) 08/13/2021 1601  ? LEUKOCYTESUR MODERATE (A) 08/13/2021 1601  ? ?Sepsis Labs: ?@LABRCNTIP (procalcitonin:4,lacticidven:4) ? ?) ?Recent Results (from the past 240 hour(s))  ?Resp Panel by RT-PCR (Flu A&B, Covid) Nasopharyngeal Swab     Status: None  ? Collection Time: 08/13/21  3:09 PM  ? Specimen: Nasopharyngeal Swab; Nasopharyngeal(NP) swabs in vial transport medium  ?Result Value Ref Range Status  ? SARS Coronavirus 2 by RT PCR NEGATIVE NEGATIVE Final  ?  Comment: (NOTE) ?SARS-CoV-2 target nucleic acids are NOT DETECTED. ? ?The SARS-CoV-2 RNA is generally detectable in upper respiratory ?specimens during the acute phase of infection. The lowest ?concentration of SARS-CoV-2 viral copies this assay can detect is ?138 copies/mL. A negative result does  not preclude SARS-Cov-2 ?infection and should not be used as the sole basis for treatment or ?other patient management decisions. A negative result may occur with  ?improper specimen collection/handling, submission of specimen other ?than nasopharyngeal swab, presence of viral mutation(s) within the ?areas targeted by this assay, and inadequate number of viral ?copies(<138 copies/mL). A negative result must be combined with ?clinical observations, patient history, and epidemiological ?information. The expected result is Negative. ? ?Fact Sheet for Patients:  ? ? ?Fact Sheet for Healthcare Providers:  ?08/15/21 ? ?This test is no t yet approved or cleared by the [employer] FDA and  ?has been authorized for detection and/or diagnosis of SARS-CoV-2 by ?FDA under an Emergency Use Authorization (EUA). This EUA will remain  ?in effect (meaning this test can be used) for the duration of the ?COVID-19 declaration under Section 564(b)(1) of the Act, 21 ?U.S.C.section 360bbb-3(b)(1), unless the authorization is terminated  ?or revoked sooner.  ? ? ?  ? Influenza A by PCR NEGATIVE NEGATIVE Final  ? Influenza B by PCR NEGATIVE NEGATIVE Final  ?  Comment: (NOTE) ?The  Xpert Xpress SARS-CoV-2/FLU/RSV plus assay is intended as an aid ?in the diagnosis of influenza from Nasopharyngeal swab specimens and ?should not be used as a sole basis for treatment. Nasal washings and ?aspirates are unacceptable for Xpert Xpress SARS-CoV-2/FLU/RSV ?testing. ? ?Fact Sheet for Patients: ?BloggerCourse.comhttps://www.fda.gov/media/152166/download ? ?Fact Sheet for Healthcare Providers: ?SeriousBroker.ithttps://www.fda.gov/media/152162/download ? ?This test is not yet approved or cleared by the [place] FDA and ?has been authorized for detection and/or diagnosis of SARS-CoV-2 by ?FDA under an Emergency Use Authorization (EUA). This EUA will remain ?in effect (meaning this test can be used) for the  duration of the ?COVID-19 declaration under Section 564(b)(1) of the Act, 21 U.S.C. ?section 360bbb-3(b)(1), unless the authorization is terminated or ?revoked. ? ?Performed at [facility], [address] [place]

## 2021-08-14 NOTE — Progress Notes (Signed)
Received patient from ED awake and confused  accompanied by her husband and transport personnel via stretcher. Oriented to the surroundings, patient handbook given, explained rights and responsibilities and visitor's guidelines. ?

## 2021-08-14 NOTE — Plan of Care (Signed)
  Problem: Education: Goal: Knowledge of General Education information will improve Description: Including pain rating scale, medication(s)/side effects and non-pharmacologic comfort measures Outcome: Progressing   Problem: Health Behavior/Discharge Planning: Goal: Ability to manage health-related needs will improve Outcome: Progressing   Problem: Safety: Goal: Ability to remain free from injury will improve Outcome: Progressing   

## 2021-08-14 NOTE — Progress Notes (Signed)
?  Transition of Care (TOC) Screening Note ? ? ?Patient Details  ?Name: [name] [name] ?Date of Birth: 1941/03/04 ? ? ?Transition of Care (TOC) CM/SW Contact:    ?Kermit Balo, RN ?Phone Number: ?08/14/2021, 3:44 PM ? ? ? ?Transition of Care Department [facility]) has reviewed patient. We will continue to monitor patient advancement through interdisciplinary progression rounds. If new patient transition needs arise, please place a TOC consult. ?  ?

## 2021-08-14 NOTE — Progress Notes (Signed)
NEUROLOGY CONSULTATION PROGRESS NOTE  ? ?Date of service: August 14, 2021 ?Patient Name: [name] [name] ?MRN:  [id number] ?DOB:  10-18-1940 ? ?Brief HPI  ?[name] [name] is a 81 y.o. female with a past medical history of vertigo, memory issues, and GERD who presented to the [facility] emergency department for increased falls and confusion.  A code stroke was called.  CT head and CTA were unremarkable.  She was noted to have a UTI, which according to her husband has caused altered mental status for her previously.  She is being treated with Rocephin for this. ?  ?Interval Hx  ? ?On assessment this afternoon, the patient is alert and talkative.  Family members in the room state the patient is currently at her baseline.  She is oriented to location and is able to state the year correctly, however, she states that the month is September. ? ?Vitals  ? ?Vitals:  ? 08/13/21 2307 08/14/21 0315 08/14/21 0840 08/14/21 1100  ?BP: (!) 107/51 (!) 104/52 (!) 107/59 (!) 111/46  ?Pulse: 71 68 72 77  ?Resp: 19 19 16 14   ?Temp: 98.7 ?F (37.1 ?C) 98 ?F (36.7 ?C) 97.7 ?F (36.5 ?C) 99.2 ?F (37.3 ?C)  ?TempSrc: Oral Oral Oral Oral  ?SpO2: 100% 97% 97% 97%  ?Weight: 65.4 kg     ?Height: 5\' 3"  (1.6 m)     ?  ? ?Body mass index is 25.54 kg/m?. ? ?Physical Exam  ? ?General: Lying comfortably in bed; in no acute distress.  ?HENT: Normal oropharynx and mucosa. Normal external appearance of ears and nose.  ?Neck: Supple, no pain or tenderness  ?CV: No peripheral edema.  ?Pulmonary: Symmetric Chest rise. Normal respiratory effort.  ?Ext: No cyanosis, edema, or deformity  ?Skin: No rash. Normal palpation of skin.   ?  ?Neurologic Examination  ?Mental status/Cognition: Alert, oriented as above ?Speech/language: Fluent, comprehension intact. ?Cranial nerves:  ? CN II Pupils equal and reactive to light, no VF deficits   ? CN III,IV,VI EOM intact, no gaze preference or deviation, no nystagmus   ? CN V normal sensation in V1, V2, and V3 segments bilaterally    ? CN VII no asymmetry, no nasolabial fold flattening   ? CN VIII normal hearing to speech   ? CN IX & X normal palatal elevation, no uvular deviation   ? CN XI 5/5 head turn and 5/5 shoulder shrug bilaterally   ? CN XII midline tongue protrusion   ?  ?Motor:  ?Mvmt Root Nerve  Muscle Right Left Comments  ?SA C5/6 Ax Deltoid 5/5 5/5    ?EF C5/6 Mc Biceps 5/5 5/5    ?EE C6/7/8 Rad Triceps 5/5 5/5    ?WF C6/7 Med FCR        ?WE C7/8 PIN ECU        ?F Ab C8/T1 U ADM/FDI        ?HF L1/2/3 Fem Illopsoas 5/5 5/5    ?KE L2/3/4 Fem Quad 5/5 5/5    ?DF L4/5 D Peron Tib Ant        ?PF S1/2 Tibial Grc/Sol        ?  ?Reflexes: ?  Right Left Comments  ?Pectoralis        ? Biceps (C5/6)      ?Brachioradialis (C5/6)      ? Triceps (C6/7)      ? Patellar (L3/4) 2+ 2+    ? Achilles (S1) 2+ 2+    ? Hoffman        ?  Plantar        ?Jaw jerk     ?  ?Sensation: ? Light touch intact  ? Pin prick    ? Temperature    ? Vibration    ?Proprioception    ?  ?Coordination/Complex Motor:  ?- Finger to Nose intact ?- Gait: deferred ? ? ?Labs  ? ?Basic Metabolic Panel:  ?Lab Results  ?Component Value Date  ? NA 139 08/14/2021  ? K 3.7 08/14/2021  ? CO2 20 (L) 08/14/2021  ? GLUCOSE 78 08/14/2021  ? BUN 10 08/14/2021  ? CREATININE 0.74 08/14/2021  ? CALCIUM 8.2 (L) 08/14/2021  ? GFRNONAA >60 08/14/2021  ? GFRAA 84 04/10/2020  ? ?HbA1c: No results found for: HGBA1C ?LDL: No results found for: LDLCALC ?Urine Drug Screen:  ?   ?Component Value Date/Time  ? LABOPIA NONE DETECTED 2021-09-05 1715  ? COCAINSCRNUR NONE DETECTED 09-05-21 1715  ? LABBENZ NONE DETECTED 2021/09/05 1715  ? AMPHETMU NONE DETECTED 05-Sep-2021 1715  ? THCU NONE DETECTED Sep 05, 2021 1715  ? LABBARB NONE DETECTED 2021-09-05 1715  ?  ?Alcohol Level  ?   ?Component Value Date/Time  ? ETH <10 09-05-21 1509  ? ?No results found for: PHENYTOIN, ZONISAMIDE, LAMOTRIGINE, LEVETIRACETA ?No results found for: PHENYTOIN, PHENOBARB, VALPROATE, CBMZ ? ?Imaging and Diagnostic studies  ? ?Code  stroke CT-head 09/06/2022: ?No evidence of acute intracranial abnormality. ASPECTS of 10 ?  ?CTA H/N 09/06/2022: ?1. No large vessel occlusion. ?2. Mild atherosclerosis in the head and neck without a significant proximal stenosis. ?3. 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 ?4. Mild focal dilatation of the right V3 segment, indeterminate though could reflect localized changes of fibromuscular dysplasia or the sequelae of an old healed dissection with possible small pseudoaneurysm. ?5. Aortic Atherosclerosis  ? ?MRI-Brain 2022-09-06: ?Evaluation is somewhat limited by motion artifact. Within this ?limitation, no acute intracranial process. ? ? ?Impression  ? ?[name] [name] is a 81 y.o. female with a past medical history of vertigo, memory issues, and GERD who presented to the [facility] emergency department for increased falls and confusion.  A code stroke was called.  CT head and CTA were unremarkable.  She was noted to have a UTI, which according to her husband has caused altered mental status for her previously.  She is being treated with Rocephin for this.  ? ?MRI negative for acute or chronic stroke.  The patient may be exhibiting some delirium but does not appear to have an acute neurological condition. ? ? ?Recommendations  ? ?-Continued treatment of UTI, ABX per primary ? ?We will sign off at this time. Please re-consult should the clinical picture require.  ? ?Carlyn Reichert, MD ?PGY-1 ? ?I have seen the patient, she is better than yesterday but still mildly confused.  She has a history of confusion with UTIs, and I think that her current presentation is consistent with this.  At this time, will defer treatment of UTI to primary team, if she gets her days and nights mixed up could consider scheduled Seroquel at 8 PM 25 mg. ? ?Neurology will be available as needed, please call if further questions or concerns. ? ?Ritta Slot, MD ?[facility] ?[phone] ? ?If 7pm- 7am, please page  neurology on call as listed in AMION. ? ?

## 2021-08-14 NOTE — Evaluation (Signed)
Occupational Therapy Evaluation Patient Details Name: [name] [name] MRN: [id number] DOB: September 06, 1940 Today's Date: 08/14/2021   History of Present Illness [name] [name] is a 81 y.o. female with medical history significant of GERD, memory issues. Pt presents to ED with increased falls, change in behavior, confusion. Her husband reports LKW around midnight. Pt outside window for tpa. CT head no acute process. MRI brain negative.   Clinical Impression   Pt is an unreliable historian secondary to cognitive limitations, no family present/available to provide PLOF. Pt currently pleasantly confused throughout session, reporting current month was September. She required minA with grooming at sink level for sequencing and termination of task. She required minA for functional mobility with hand held assistance due to poor use of RW despite max cues from therapist. Pt had BM while on commode. Will continue to follow acutely. Recommend HHOT with 24/7 supervision/assistance at discharge.       Recommendations for follow up therapy are one component of a multi-disciplinary discharge planning process, led by the attending physician.  Recommendations may be updated based on patient status, additional functional criteria and insurance authorization.   Follow Up Recommendations  Home health OT    Assistance Recommended at Discharge Frequent or constant Supervision/Assistance  Patient can return home with the following A little help with walking and/or transfers;A little help with bathing/dressing/bathroom;Direct supervision/assist for financial management;Direct supervision/assist for medications management    Functional Status Assessment  Patient has had a recent decline in their functional status and demonstrates the ability to make significant improvements in function in a reasonable and predictable amount of time.  Equipment Recommendations  None recommended by OT    Recommendations for Other Services        Precautions / Restrictions Precautions Precautions: Fall Restrictions Weight Bearing Restrictions: No      Mobility Bed Mobility Overal bed mobility: Needs Assistance Bed Mobility: Supine to Sit     Supine to sit: Supervision     General bed mobility comments: supervision for safety    Transfers Overall transfer level: Needs assistance Equipment used: Rolling walker (2 wheels), 1 person hand held assist Transfers: Sit to/from Stand Sit to Stand: Min assist           General transfer comment: minA to powerup into standing from a low surface, pt required minA for stability and safety. initially utilized RW but then pt progressed to hand held assistance      Balance Overall balance assessment: Mild deficits observed, not formally tested                                         ADL either performed or assessed with clinical judgement   ADL Overall ADL's : Needs assistance/impaired Eating/Feeding: Set up;Sitting   Grooming: Minimal assistance;Standing Grooming Details (indicate cue type and reason): cues for sequencing and termination of task Upper Body Bathing: Set up;Sitting   Lower Body Bathing: Minimal assistance;Sit to/from stand   Upper Body Dressing : Set up;Sitting   Lower Body Dressing: Minimal assistance;Sit to/from stand Lower Body Dressing Details (indicate cue type and reason): assist for access to bilateral feet and assist with powerup and stability Toilet Transfer: Minimal assistance Toilet Transfer Details (indicate cue type and reason): initially ambulating with RW, pt with poor management despite max cues progressing to hand held assistance with functional mobility and toilet transfer Toileting- Clothing Manipulation and Hygiene: Minimal assistance;Sit  to/from stand Toileting - Architect Details (indicate cue type and reason): pt completed posterior care while sitting;pt had a BM on commod     Functional  mobility during ADLs: Minimal assistance General ADL Comments: pt utilizing RW intially but progressing to hand held assistance with minA for safety and stability.     Vision Patient Visual Report: No change from baseline       Perception     Praxis      Pertinent Vitals/Pain Pain Assessment Pain Assessment: No/denies pain     Hand Dominance Right   Extremity/Trunk Assessment Upper Extremity Assessment Upper Extremity Assessment: Overall WFL for tasks assessed   Lower Extremity Assessment Lower Extremity Assessment: Defer to PT evaluation   Cervical / Trunk Assessment Cervical / Trunk Assessment: Normal   Communication Communication Communication: No difficulties   Cognition Arousal/Alertness: Awake/alert Behavior During Therapy: WFL for tasks assessed/performed Overall Cognitive Status: No family/caregiver present to determine baseline cognitive functioning                                 General Comments: Pt oriented to self, knew she was in a hospital, unable to provide reason for admission. She reported the month was september. required cues for safety awareness, sequencing, and cues for complete termination of grooming task.     General Comments  vss on RA    Exercises     Shoulder Instructions      Home Living Family/patient expects to be discharged to:: Private residence Living Arrangements: Spouse/significant other Available Help at Discharge: Family;Available PRN/intermittently                             Additional Comments: Pt is an unreliable historian 2/2 cognitive limitations, reports she and her husband are renting an apartment with 4steps to enter, with a walk-in shower with a shower seat.      Prior Functioning/Environment Prior Level of Function : Independent/Modified Independent;History of Falls (last six months);Patient poor historian/Family not available             Mobility Comments: pt reports  independence with functional mobility, denies use of AD;no family present/available during session to assist with gathering PLOF information ADLs Comments: pt reports she was independent with ADL/IADL. no family available/present to assist with gathering PLOF information        OT Problem List: Decreased activity tolerance;Decreased safety awareness;Decreased cognition      OT Treatment/Interventions: Self-care/ADL training;Therapeutic exercise;Cognitive remediation/compensation;Patient/family education;Balance training    OT Goals(Current goals can be found in the care plan section) Acute Rehab OT Goals Patient Stated Goal: to go home OT Goal Formulation: With patient Time For Goal Achievement: 08/28/21 Potential to Achieve Goals: Good ADL Goals Pt Will Perform Grooming: with modified independence;standing Pt Will Perform Lower Body Dressing: with modified independence;sit to/from stand Pt Will Transfer to Toilet: with modified independence;ambulating Additional ADL Goal #1: Pt will demonstrate anticipatory awareness for safe engagement in ADL/IADL and functional mobility. Additional ADL Goal #2: Pt will complete multistep cognitive task with <3 errors.  OT Frequency: Min 2X/week    Co-evaluation              AM-PAC OT "6 Clicks" Daily Activity     Outcome Measure Help from another person eating meals?: A Little Help from another person taking care of personal grooming?: A Little Help from another person toileting, which  includes using toliet, bedpan, or urinal?: A Little Help from another person bathing (including washing, rinsing, drying)?: A Little Help from another person to put on and taking off regular upper body clothing?: None Help from another person to put on and taking off regular lower body clothing?: A Little 6 Click Score: 19   End of Session Equipment Utilized During Treatment: Gait belt;Rolling walker (2 wheels) Nurse Communication: Mobility  status  Activity Tolerance: Patient tolerated treatment well Patient left: in bed;with call bell/phone within reach;with bed alarm set  OT Visit Diagnosis: Other abnormalities of gait and mobility (R26.89);Other symptoms and signs involving cognitive function                Time: 1206-1228 OT Time Calculation (min): 22 min Charges:  OT General Charges $OT Visit: 1 Visit OT Evaluation $OT Eval Moderate Complexity: 1 Mod  Alfonse Garringer OTR/L Acute Rehabilitation Services Office: [phone]   Rebeca Alert 08/14/2021, 1:20 PM

## 2021-08-15 LAB — CBC
HCT: 35.3 % — ABNORMAL LOW (ref 36.0–46.0)
Hemoglobin: 12 g/dL (ref 12.0–15.0)
MCH: 32.8 pg (ref 26.0–34.0)
MCHC: 34 g/dL (ref 30.0–36.0)
MCV: 96.4 fL (ref 80.0–100.0)
Platelets: 204 10*3/uL (ref 150–400)
RBC: 3.66 MIL/uL — ABNORMAL LOW (ref 3.87–5.11)
RDW: 12.7 % (ref 11.5–15.5)
WBC: 6.4 10*3/uL (ref 4.0–10.5)
nRBC: 0 % (ref 0.0–0.2)

## 2021-08-15 LAB — RENAL FUNCTION PANEL
Albumin: 2.8 g/dL — ABNORMAL LOW (ref 3.5–5.0)
Anion gap: 8 (ref 5–15)
BUN: 8 mg/dL (ref 8–23)
CO2: 22 mmol/L (ref 22–32)
Calcium: 8.2 mg/dL — ABNORMAL LOW (ref 8.9–10.3)
Chloride: 109 mmol/L (ref 98–111)
Creatinine, Ser: 0.64 mg/dL (ref 0.44–1.00)
GFR, Estimated: >60 mL/min (ref >60)
Glucose, Bld: 89 mg/dL (ref 70–99)
Phosphorus: 2.7 mg/dL (ref 2.5–4.6)
Potassium: 4.2 mmol/L (ref 3.5–5.1)
Sodium: 139 mmol/L (ref 135–145)

## 2021-08-15 LAB — MAGNESIUM: Magnesium: 1.8 mg/dL (ref 1.7–2.4)

## 2021-08-15 MED ORDER — CEFADROXIL 500 MG PO CAPS: 1000.0000 mg | ORAL_CAPSULE | Freq: Two times a day (BID) | ORAL | 0 refills | Status: DC

## 2021-08-15 MED ORDER — CEFADROXIL 500 MG PO CAPS
1000.0000 mg | ORAL_CAPSULE | Freq: Two times a day (BID) | ORAL | Status: DC
  Administered 2021-08-15: 1000 mg via ORAL
  Filled 2021-08-15 (×2): qty 2

## 2021-08-15 NOTE — TOC Transition Note (Signed)
Transition of Care (TOC) - CM/SW Discharge Note ? ? ?Patient Details  ?Name: [name] [name] ?MRN: [id number] ?Date of Birth: Sep 18, 1940 ? ?Transition of Care (TOC) CM/SW Contact:  ?Lawerance Sabal, RN ?Phone Number: ?08/15/2021, 11:00 AM ? ? ?Clinical Narrative:    ? ?Spoke w spouse, patient could not hear over the phone. ?Referral made to [facility] for vestibular PT. ?Spouse understands to call for faster scheduling, otherwise office will reach out for appointment.  ?No DME or med needs, no other TOC needs identified ? ? ?  ?  ? ? ? ?           ?  ?  ?  ?  ?  ?  ?  ?  ?  ?  ? ?  ? ? ?Readmission Risk Interventions ?Readmission Risk Prevention Plan 08/15/2021  ?Post Dischage Appt Complete  ?Medication Screening Complete  ?Transportation Screening Complete  ?Some recent data might be hidden  ? ? ? ? ? ?

## 2021-08-15 NOTE — Discharge Summary (Signed)
?Discharge Summary ? ?[name] [name]:[id number] DOB: Jun 14, 1940 ? ?PCP: [name], MD ? ?Admit date: 08/13/2021 ?Discharge date: 08/15/2021 ? ?Time spent: 35 minutes. ? ?Recommendations for Outpatient Follow-up:  ?Follow-up with your primary care provider ?Take your medications as prescribed ?Continue PT OT with assistance and fall precautions. ? ?Discharge Diagnoses:  ?Active Hospital Problems  ? Diagnosis Date Noted  ? Acute metabolic encephalopathy 08/13/2021  ?  Priority: High  ? UTI (urinary tract infection) 08/13/2021  ?  Priority: High  ? Memory loss 08/13/2021  ?  Priority: Low  ?  ?Resolved Hospital Problems  ?No resolved problems to display.  ? ? ?Discharge Condition: Stable. ? ?Diet recommendation: Resume previous diet. ? ?Vitals:  ? 08/15/21 0722 08/15/21 1059  ?BP: 97/60 129/68  ?Pulse: 67 81  ?Resp: 14 14  ?Temp: 98.5 ?F (36.9 ?C) 98.8 ?F (37.1 ?C)  ?SpO2: 95% 96%  ? ? ?History of present illness:  ? [name] [name] is a 81 y.o. female with medical history significant of GERD, memory issues.  Pt presents to ED with increased falls, change in behavior, and worsening confusion.  MRI brain negative for acute CVA.  Work-up revealed acute metabolic encephalopathy secondary to presumptive UTI.  UA positive for pyuria on admission.  Urine culture grew greater than 100,000 colonies of E. coli.  Seen by neurology, nothing to add from a neurological standpoint. ?  ?08/15/2021: Patient was seen and examined at bedside.  There were no acute events overnight.  She is alert and interactive.  Per husband at bedside she is back to her baseline mentation. ? ?Hospital Course:  ?Principal Problem: ?  Acute metabolic encephalopathy ?Active Problems: ?  UTI (urinary tract infection) ?  Memory loss ? ?Resolved acute metabolic encephalopathy secondary to E. coli UTI, POA. ?Mentation is back to baseline at the time of the discharge. ?MRI brain negative for acute CVA. ?Urine analysis positive for pyuria ?Urine culture grew  greater than 100,000 colonies of E. coli, sensitivities are pending. ?Received Rocephin empirically for E. coli . ?Discharge home cefadroxil x5 days. ?Follow-up with your primary care provider. ?  ?E. coli UTI (urinary tract infection), POA ?Received Rocephin ?Management as stated above. ?  ?Resolved non-anion gap metabolic acidosis ?Post IV fluid hydration ?Anion gap 8, serum bicarb 22 from 20 from 18. ?  ?Memory loss ?Continue home Aricept ?  ?Physical debility ?Continue PT OT with assistance and fall precautions. ?  ?  ?Code Status: Full code ?  ? ?  ?Consultants: ?Neurology, signed off. ?  ?Procedures: ?None ?  ?Antimicrobials: ?Rocephin ?  ? ?Discharge Exam: ?BP 129/68 (BP Location: Right Arm)   Pulse 81   Temp 98.8 ?F (37.1 ?C) (Oral)   Resp 14   Ht 5\' 3"  (1.6 m)   Wt 65.4 kg   SpO2 96%   BMI 25.54 kg/m?  ?General: 81 y.o. year-old female well developed well nourished in no acute distress.  Alert and pleasantly demented. ?Cardiovascular: Regular rate and rhythm with no rubs or gallops.  No thyromegaly or JVD noted.   ?Respiratory: Clear to auscultation with no wheezes or rales. Good inspiratory effort. ?Abdomen: Soft nontender nondistended with normal bowel sounds x4 quadrants. ?Musculoskeletal: No lower extremity edema bilaterally. ?Psychiatry: Mood is appropriate for condition and setting ? ?Discharge Instructions ?You were cared for by a hospitalist during your hospital stay. If you have any questions about your discharge medications or the care you received while you were in the hospital after you are  discharged, you can call the unit and asked to speak with the hospitalist on call if the hospitalist that took care of you is not available. Once you are discharged, your primary care physician will handle any further medical issues. Please note that NO REFILLS for any discharge medications will be authorized once you are discharged, as it is imperative that you return to your primary care physician  (or establish a relationship with a primary care physician if you do not have one) for your aftercare needs so that they can reassess your need for medications and monitor your lab values. ? ?Discharge Instructions   ? ? Ambulatory referral to Physical Therapy   Complete by: As directed ?  ? Vestibular PT  ? ?  ? ?Allergies as of 08/15/2021   ? ?   Reactions  ? Streptomycin   ? ?  ? ?  ?Medication List  ?  ? ?TAKE these medications   ? ?acetaminophen 500 MG tablet ?Commonly known as: TYLENOL ?Take 1,000 mg by mouth every 6 (six) hours as needed for moderate pain or headache. ?  ?cefadroxil 500 MG capsule ?Commonly known as: DURICEF ?Take 2 capsules (1,000 mg total) by mouth 2 (two) times daily for 5 days. ?  ?OVER THE COUNTER MEDICATION ?Take 3 capsules by mouth in the morning and at bedtime. Balance of nature ?  ?Transderm-Scop (1.5 MG) 1 MG/3DAYS ?Generic drug: scopolamine ?Place 1 patch onto the skin See admin instructions. 1 patch every 3 days as needed for dizziness ?  ? ?  ? ?Allergies  ?Allergen Reactions  ? Streptomycin   ? ? Follow-up Information   ? ? [facility] Follow [facility].   ?Specialty: Rehabilitation ?Why: call for quicker scheduling.  ?Vestibular PT ?Contact information: ?[address] ?[id number] mc ?[place] [id number] ?[phone] ? ?  ?  ? ?  ?  ? ?  ? ? ? ?The results of significant diagnostics from this hospitalization (including imaging, microbiology, ancillary and laboratory) are listed below for reference.   ? ?Significant Diagnostic Studies: ?MR BRAIN WO CONTRAST ? ?Result Date: 08/13/2021 ?CLINICAL DATA:  Left-sided neglect, stroke suspected EXAM: MRI HEAD WITHOUT CONTRAST TECHNIQUE: Multiplanar, multiecho pulse sequences of the brain and surrounding structures were obtained without intravenous contrast. COMPARISON:  05/02/2020 MRI head, correlation is also made with 08/13/2021 CT head and CTA head neck FINDINGS: Evaluation is  somewhat limited by motion artifact. Brain: No restricted diffusion to suggest acute or subacute infarct. No acute hemorrhage, mass, mass effect, or midline shift. No hydrocephalus or extra-axial collection. Scattered T2 hyperintense signal in the periventricular white matter, likely the sequela of mild chronic small vessel ischemic disease. Vascular: Normal flow voids. Skull and upper cervical spine: Normal marrow signal. Sinuses/Orbits: Negative. Other: The mastoids are well aerated. IMPRESSION: Evaluation is somewhat limited by motion artifact. Within this limitation, no acute intracranial process. Electronically Signed   By: Wiliam KeAlison  Vasan M.D.   On: 08/13/2021 19:03  ? ?DG Chest Portable 1 View ? ?Result Date: 08/13/2021 ?CLINICAL DATA:  Altered mental status.  Code stroke.  Confusion. EXAM: PORTABLE CHEST 1 VIEW COMPARISON:  None. FINDINGS: Cardiac silhouette and mediastinal contours are within normal limits with calcification seen within the aortic arch. The lungs are clear. No pleural effusion or pneumothorax. Mild levocurvature of the lower thoracic spine. Mild multilevel degenerative disc changes. IMPRESSION: No acute cardiopulmonary disease process. Electronically Signed   By: Neita Garnetonald  Viola M.D.   On: 08/13/2021 16:13  ? ?  CT HEAD CODE STROKE WO CONTRAST` ? ?Result Date: 08/13/2021 ?CLINICAL DATA:  Code stroke. Neuro deficit, acute, stroke suspected. Left-sided neglect. EXAM: CT HEAD WITHOUT CONTRAST TECHNIQUE: Contiguous axial images were obtained from the base of the skull through the vertex without intravenous contrast. RADIATION DOSE REDUCTION: This exam was performed according to the departmental dose-optimization program which includes automated exposure control, adjustment of the mA and/or kV according to patient size and/or use of iterative reconstruction technique. COMPARISON:  Head MRI 05/02/2020 FINDINGS: Brain: There is no evidence of an acute infarct, intracranial hemorrhage, mass, midline shift,  or extra-axial fluid collection. Mild cerebral atrophy is within normal limits for age. Hypodensities in the cerebral white matter bilaterally are nonspecific but compatible with minimal chronic small vessel isch

## 2021-08-16 LAB — URINE CULTURE: Culture: >=100000 — AB

## 2021-08-18 ENCOUNTER — Encounter: Payer: Self-pay | Admitting: Physician Assistant

## 2021-08-18 ENCOUNTER — Ambulatory Visit: Payer: Medicare PPO | Admitting: Physician Assistant

## 2021-08-18 VITALS — BP 130/80 | HR 70 | Ht 63.0 in | Wt 143.0 lb

## 2021-08-18 DIAGNOSIS — K222 Esophageal obstruction: Secondary | ICD-10-CM

## 2021-08-18 DIAGNOSIS — R1319 Other dysphagia: Secondary | ICD-10-CM

## 2021-08-18 DIAGNOSIS — K449 Diaphragmatic hernia without obstruction or gangrene: Secondary | ICD-10-CM | POA: Diagnosis not present

## 2021-08-18 MED ORDER — PANTOPRAZOLE SODIUM 40 MG PO TBEC: 40.0000 mg | DELAYED_RELEASE_TABLET | Freq: Every day | ORAL | 1 refills | Status: DC

## 2021-08-18 NOTE — Patient Instructions (Addendum)
You have been scheduled for an endoscopy. Please follow written instructions given to you at your visit today. ?If you use inhalers (even only as needed), please bring them with you on the day of your procedure. ? ?Silent reflux: Not all heartburn burns...... ? ?What is LPR? ?Laryngopharyngeal reflux (LPR) or silent reflux is a condition in which acid that is made in the stomach travels up the esophagus (swallowing tube) and gets to the throat. ?Not everyone with reflux has a lot of heartburn or indigestion. In fact, many people with LPR never have heartburn. This is why LPR is called SILENT REFLUX, and the terms "Silent reflux" and "LPR" are often used interchangeably. Because LPR is silent, it is sometimes difficult to diagnose. ? ?How can you tell if you have LPR?  ?Chronic hoarseness- Some people have hoarseness that comes and goes ?throat clearing  ?Cough ?It can cause shortness of breath and cause asthma like symptoms. ?a feeling of a lump in the throat  ?difficulty swallowing ?a problem with too much nose and throat drainage.  ?Some people will feel their esophagus spasm which feels like their heart beating hard and fast, this will usually be after a meal, at rest, or lying down at night.   ? ?How do I treat this? ?Treatment for LPR should be individualized, and your doctor will suggest the best treatment for you. Generally there are several treatments for LPR: ?changing habits and diet to reduce reflux,  ?medications to reduce stomach acid, and  ?surgery to prevent reflux. ?Most people with LPR need to modify how and when they eat, as well as take some medication, to get well. Sometimes, nonprescription liquid antacids, such as Maalox?, Gelucil? and Mylanta? are recommended. When used, these antacids should be taken four times each day - one tablespoon one hour after each meal and before bedtime. ?Dietary and lifestyle changes alone are not often enough to control LPR - medications that reduce stomach acid  are also usually needed. These must be prescribed by our doctor. ? ? ?TIPS FOR REDUCING REFLUX AND LPR ?Control your LIFE-STYLE and your DIET! ?If you use tobacco, QUIT.  ?Smoking makes you reflux. After every cigarette you have some LPR.  ?Don't wear clothing that is too tight, especially around the waist (trousers, corsets, belts).  ?Do not lie down just after eating...in fact, do not eat within three hours of bedtime.  ?You should be on a low-fat diet.  ?Limit your intake of red meat.  ?Limit your intake of butter.  ?Avoid fried foods.  ?Avoid chocolate  ?Avoid cheese.  ?Avoid eggs. ?Specifically avoid caffeine (especially coffee and tea), soda pop (especially cola) and mints.  ?Avoid alcoholic beverages, particularly in the evening. ? ?Hiatal Hernia ?A hiatal hernia occurs when part of the stomach slides above the muscle that separates the abdomen from the chest (diaphragm). A person can be born with a hiatal hernia (congenital), or it may develop over time. In almost all cases of hiatal hernia, only the top part of the stomach pushes through the diaphragm. ?Many people have a hiatal hernia with no symptoms. The larger the hernia, the more likely it is that you will have symptoms. In some cases, a hiatal hernia allows stomach acid to flow back into the tube that carries food from your mouth to your stomach (esophagus). This may cause heartburn symptoms. Severe heartburn symptoms may mean that you have developed a condition called gastroesophageal reflux disease (GERD). ?What are the causes? ?This condition is caused  by a weakness in the opening (hiatus) where the esophagus passes through the diaphragm to attach to the upper part of the stomach. A person may be born with a weakness in the hiatus, or a weakness can develop over time. ?What increases the risk? ?This condition is more likely to develop in: ?Older people. Age is a major risk factor for a hiatal hernia, especially if you are over the age of  57. ?Pregnant women. ?People who are overweight. ?People who have frequent constipation. ?What are the signs or symptoms? ?Symptoms of this condition usually develop in the form of GERD symptoms. Symptoms include: ?Heartburn. ?Belching. ?Indigestion. ?Trouble swallowing. ?Coughing or wheezing. ?Sore throat. ?Hoarseness. ?Chest pain. ?Nausea and vomiting. ?How is this diagnosed? ?This condition may be diagnosed during testing for GERD. Tests that may be done include: ?X-rays of your stomach or chest. ?An upper gastrointestinal (GI) series. This is an X-ray exam of your GI tract that is taken after you swallow a chalky liquid that shows up clearly on the X-ray. ?Endoscopy. This is a procedure to look into your stomach using a thin, flexible tube that has a tiny camera and light on the end of it. ?How is this treated? ?This condition may be treated by: ?Dietary and lifestyle changes to help reduce GERD symptoms. ?Medicines. These may include: ?Over-the-counter antacids. ?Medicines that make your stomach empty more quickly. ?Medicines that block the production of stomach acid (H2 blockers). ?Stronger medicines to reduce stomach acid (proton pump inhibitors). ?Surgery to repair the hernia, if other treatments are not helping. ?If you have no symptoms, you may not need treatment. ?Follow these instructions at home: ?Lifestyle and activity ?Do not use any products that contain nicotine or tobacco, such as cigarettes and e-cigarettes. If you need help quitting, ask your health care provider. ?Try to achieve and maintain a healthy body weight. ?Avoid putting pressure on your abdomen. Anything that puts pressure on your abdomen increases the amount of acid that may be pushed up into your esophagus. ?Avoid bending over, especially after eating. ?Raise the head of your bed by putting blocks under the legs. This keeps your head and esophagus higher than your stomach. ?Do not wear tight clothing around your chest or stomach. ?Try  not to strain when having a bowel movement, when urinating, or when lifting heavy objects. ?Eating and drinking ?Avoid foods that can worsen GERD symptoms. These may include: ?Fatty foods, like fried foods. ?Citrus fruits, like oranges or lemon. ?Other foods and drinks that contain acid, like orange juice or tomatoes. ?Spicy food. ?Chocolate. ?Eat frequent small meals instead of three large meals a day. This helps prevent your stomach from getting too full. ?Eat slowly. ?Do not lie down right after eating. ?Do not eat 1-2 hours before bed. ?Do not drink beverages with caffeine. These include cola, coffee, cocoa, and tea. ?Do not drink alcohol. ?General instructions ?Take over-the-counter and prescription medicines only as told by your health care provider. ?Keep all follow-up visits as told by your health care provider. This is important. ?Contact a health care provider if: ?Your symptoms are not controlled with medicines or lifestyle changes. ?You are having trouble swallowing. ?You have coughing or wheezing that will not go away. ?Get help right away if: ?Your pain is getting worse. ?Your pain spreads to your arms, neck, jaw, teeth, or back. ?You have shortness of breath. ?You sweat for no reason. ?You feel sick to your stomach (nauseous) or you vomit. ?You vomit blood. ?You have bright red  blood in your stools. ?You have black, tarry stools. ?Summary ?A hiatal hernia occurs when part of the stomach slides above the muscle that separates the abdomen from the chest (diaphragm). ?A person may be born with a weakness in the hiatus, or a weakness can develop over time. ?Symptoms of hiatal hernia may include heartburn, trouble swallowing, or sore throat. ?Management of hiatal hernia includes eating frequent small meals instead of three large meals a day. ?Get help right away if you vomit blood, have bright red blood in your stools, or have black, tarry stools. ?This information is not intended to replace advice given  to you by your health care provider. Make sure you discuss any questions you have with your health care provider. ?Document Revised: 04/17/2020 Document Reviewed: 04/17/2020 ?Elsevier Patient Education ? 2022 [place]

## 2021-08-24 DIAGNOSIS — C44329 Squamous cell carcinoma of skin of other parts of face: Secondary | ICD-10-CM | POA: Diagnosis not present

## 2021-08-24 DIAGNOSIS — Z85828 Personal history of other malignant neoplasm of skin: Secondary | ICD-10-CM | POA: Diagnosis not present

## 2021-08-25 ENCOUNTER — Ambulatory Visit: Payer: Medicare PPO | Admitting: Physical Therapy

## 2021-09-01 ENCOUNTER — Ambulatory Visit: Payer: Medicare PPO | Admitting: Gastroenterology

## 2021-09-01 MED ORDER — SODIUM CHLORIDE 0.9 % IV SOLN: 500.0000 mL | Freq: Once | INTRAVENOUS | Status: DC

## 2021-09-01 NOTE — Progress Notes (Signed)
Pt states that she ate a salad and strawberries and had tea with lemon at 1:30 pm.  Per pts husband she has some dementia and he does not think she ate a salad.  He confirms that she had coffee with creamer at 1215 but is not 100% sure that she has been otherwise NPO this morning.  Discussed with Dr. Russella Dar and he recommends we reschedule pt due to uncertainty of NPO status.  Pt rescheduled for next available appointment 5/15 at 10:00 am and PV 5/1 at 2:30 pm.  Information given to patient and husband.  Advised her per Dr. Russella Dar to try to avoid foods that are difficult to swallow.   ?

## 2021-09-09 ENCOUNTER — Ambulatory Visit: Payer: Medicare PPO | Attending: Internal Medicine | Admitting: Physical Therapy

## 2021-09-09 ENCOUNTER — Encounter: Payer: Self-pay | Admitting: Physical Therapy

## 2021-09-09 DIAGNOSIS — H8112 Benign paroxysmal vertigo, left ear: Secondary | ICD-10-CM | POA: Diagnosis not present

## 2021-09-09 DIAGNOSIS — R42 Dizziness and giddiness: Secondary | ICD-10-CM | POA: Diagnosis not present

## 2021-09-09 DIAGNOSIS — R2681 Unsteadiness on feet: Secondary | ICD-10-CM | POA: Insufficient documentation

## 2021-09-09 DIAGNOSIS — H8111 Benign paroxysmal vertigo, right ear: Secondary | ICD-10-CM | POA: Insufficient documentation

## 2021-09-09 NOTE — Therapy (Signed)
?OUTPATIENT PHYSICAL THERAPY VESTIBULAR EVALUATION ? ? ? ? ?Patient Name: [name] [name] ?MRN: [id number] ?DOB:1941/01/14, 81 y.o., female ?Today's Date: 09/09/2021 ? ?PCP: Johny BlamerHarris, William, MD ?REFERRING PROVIDER: Darlin DropHall, Carole N, DO ? ? PT End of Session - 09/09/21 1557   ? ? Visit Number 1   ? Number of Visits 9   ? Date for PT Re-Evaluation 11/08/21   ? Authorization Type [employer]   ? PT Start Time 1017   ? PT Stop Time 1100   ? PT Time Calculation (min) 43 min   ? Activity Tolerance Other (comment)   limited by dizziness  ? [facility] tasks assessed/performed;Agitated   ? ?  ?  ? ?  ? ? ?Past Medical History:  ?Diagnosis Date  ? Arthritis   ? Dizziness   ? Esophageal stricture   ? Gastroesophageal reflux disease   ? Hiatal hernia   ? Hiatal hernia   ? Varicose veins   ? Vertigo   ? chronic with exacerbation  ? ?Past Surgical History:  ?Procedure Laterality Date  ? BLADDER SUSPENSION    ? x 2  ? ENDOVENOUS ABLATION SAPHENOUS VEIN W/ LASER Right 09-05-2013  ? right greater saphenous vein by Gretta Beganodd Early MD  ? ENDOVENOUS ABLATION SAPHENOUS VEIN W/ LASER Left 10-18-2013  ? endovenous laser ablation left greater saphenous vein by Gretta Beganodd Early MD  ? VAGINAL HYSTERECTOMY    ? ?Patient Active Problem List  ? Diagnosis Date Noted  ? Acute metabolic encephalopathy 08/13/2021  ? Memory loss 08/13/2021  ? UTI (urinary tract infection) 08/13/2021  ? Varicose veins of lower extremities with other complications 05/11/2013  ? Swelling of limb 05/11/2013  ? Pure hypercholesterolemia 06/07/2011  ? ALLERGIC RHINITIS 06/26/2007  ? GASTROESOPHAGEAL REFLUX DISEASE 06/26/2007  ? Diaphragmatic hernia 06/26/2007  ? VERTIGO 06/26/2007  ? DYSPHAGIA UNSPECIFIED 06/26/2007  ? ESOPHAGEAL STRICTURE 05/03/2007  ? ? ?ONSET DATE: 08/15/2021 (date of referral) ? ?REFERRING DIAG: R42 (ICD-10-CM) - Dizziness and giddiness  ? ?THERAPY DIAG:  ?BPPV (benign paroxysmal positional vertigo), right ? ?BPPV (benign paroxysmal positional  vertigo), left ? ?Unsteadiness on feet ? ?Dizziness and giddiness ? ?SUBJECTIVE:  ? ?SUBJECTIVE STATEMENT: ?Pt reports that her husband brought her here and she is not sure why she is here. Recently was hospitalized in March 2023 for acute metabolic encephalopathy secondary to presumptive UTI. Has been to the ENT a few times over the past few years. [facility] 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 Pt uses scopolamine patches behind her ear to help with the dizziness. Pt's husband reports pt is unable to play with her grand kids as much due to having to sit down due to dizziness.  ? ?Pt accompanied by: significant other husband [name] ? ?PERTINENT HISTORY:  medical history significant of GERD, memory issues.  Pt presented to ED on 08/13/21  with increased falls, change in behavior, and worsening confusion.  MRI brain negative for acute CVA.  Work-up revealed acute metabolic encephalopathy secondary to presumptive UTI.  ? ?Per audiology note from 02/2021 from Dr. Zella BallWorkman: ?Mrs. Portilla exhibits a high-frequency vestibular ocular reflex deficit bilaterally as measured on vHIT testing. This bilateral deficit is of unclear etiology. She denies ever being treated with IV antibiotics or antineoplastic medications. 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  ? ? ?PAIN:  ?Are you having pain? No ? ?PRECAUTIONS: Other: Chronic dizziness ? ? ?FALLS: Has patient fallen in last 6 months? No ? ?LIVING ENVIRONMENT: ?Lives with: lives with their spouse ? ? ?PLOF: Independent ? ?PATIENT GOALS Wants to get the dizziness go away.  ? ?OBJECTIVE:  ? ?DIAGNOSTIC FINDINGS: MRI brain 08/13/21: ?Brain: No restricted diffusion to suggest acute or subacute infarct. ?No acute hemorrhage, mass, mass effect, or  midline shift. No ?hydrocephalus or extra-axial collection. Scattered T2 hyperintense ?signal in the periventricular white matter, likely the sequela of ?mild chronic small vessel ischemic disease. ?  ? ?COGNITION: ?Overall cognitive status:  pt asks the same couple of questions multiple times throughout evaluation after PT giving thorough answer.  ?  ? ?PATIENT SURVEYS:  ?FOTO DPS 50 (58 predicted) ?DFS 48.8 ? ?VESTIBULAR ASSESSMENT ? ? GENERAL OBSERVATION: Ambulates into session with no AD.  ?  ? SYMPTOM BEHAVIOR: ?  Subjective history: Has had dizziness for about 20 years and has been coming and going. Has never had PT for dizziness before. Sleeps with her head elevated with 3 pillows.  ?  Non-Vestibular symptoms:  pt reports none ?  Type of dizziness: Imbalance (Disequilibrium) ?  Frequency: episodes come and go ?  Duration: varies, sometimes a couple hours or a few days.  ?  Aggravating factors:Induced by motion: turning head quickly, sitting in a moving car, and moving quickly, and bending over.  ?  Relieving factors: head stationary ?  Progression of symptoms: better ? ? OCULOMOTOR EXAM: ?  Ocular Alignment: normal - head is tilted to the L  ?  Ocular ROM: No Limitations ?  Spontaneous Nystagmus: absent ?  Gaze-Induced Nystagmus: absent ?  Smooth Pursuits: intact ?  Saccades: intact ?  ? ? ? VESTIBULAR - OCULAR REFLEX:  ?  Slow VOR: Normal ?  VOR Cancellation: Normal - pt reports mild dizziness ?  Head-Impulse Test: HIT Right: positive ?HIT Left: positive Mild dizziness afterwards  ?  Dynamic Visual Acuity: did not have time to test today  ?  ? POSITIONAL TESTING: Right Dix-Hallpike: upbeating, right nystagmus; Duration:~5 seconds before pt screaming and demanding to be brought to an upright position.  ? ?Left Dix-Hallpike: brief, up beating nystagmus for ~5 seconds, unable to determine direction due to short duration. Pt reporting mild dizziness that subsided quickly.  ?  ? ?MOTION SENSITIVITY: ? ?  Motion  Sensitivity Quotient ? ?Intensity: 0 = none, 1 = Lightheaded, 2 = Mild, 3 = Moderate, 4 = Severe, 5 = Vomiting ? Intensity  ?1. Sitting to supine   ?2. Supine to L side   ?3. Supine to R side   ?4. Supine to sitting   ?5. L Hallpike-Dix 2  ?6. Up from L  3  ?7. R Hallpike-Dix 4  ?8. Up from R  4  ?9. Sitting, head  ?tipped to L knee   ?10. Head up from L  ?knee   ?11. Sitting, head  ?tipped to R knee   ?12. Head up from R  ?knee   ?13. Sitting head turns x5   ?14.Sitting head nods x5   ?15. In stance, 180?  ?turn to L    ?16. In stance, 180?  ?turn to R   ? ? ? ?PATIENT EDUCATION: ?Education details: Spent an extensive amount of time during eval providing education on examination findings (also consistent from pt's exam from the audiologist in Oct 2022)  and that pt is positive for R posterior canal BPPV. Educated pt and answered pt's questions on how BPPV would need to be treated, what causes BPPV and pathophysiology, vestibular hypofunction deficits and how BPPV/vestibular deficits found during eval can make pt feel dizzy/unsteady.  ?Person educated: Patient and Spouse ?Education method: Explanation ?Education comprehension: verbalized understanding and needs further education ? ? ?GOALS: ?Goals reviewed with patient? Yes ? ?SHORT TERM GOALS: ALL STGS = LTGS ? ? ? ?LONG TERM GOALS: Target date: 10/07/2021 ? ? ?Pt will be independent with initial HEP for vestibular/balance deficits.  ?Baseline:  ?Goal status: INITIAL ? ?2.  Pt will demo resolution of R posterior canal BPPV in order to decr dizziness/unsteadiness for improved functional mobility.  ?Baseline:  ?Goal status: INITIAL ? ?3.  Pt will undergo further testing of mCTSIB with goal written if appropriate. ?Baseline:  ?Goal status: INITIAL ? ?4.  Pt will undergo further testing of FGA with goal written if appropriate. ?Baseline:  ?Goal status: INITIAL ? ?5.  Pt will improve DPS to at least a 58 in order to demo improved functional outcomes related to dizziness.   ?Baseline: 50 ?Goal status: INITIAL ? ? ? ?ASSESSMENT: ? ?CLINICAL IMPRESSION: ?Patient is a 81 year old female referred to Neuro OPPT for dizziness.   Pt's PMH is significant for: GERD, memory issues,

## 2021-09-15 ENCOUNTER — Ambulatory Visit: Payer: Medicare PPO | Admitting: Physical Therapy

## 2021-09-15 DIAGNOSIS — H8111 Benign paroxysmal vertigo, right ear: Secondary | ICD-10-CM

## 2021-09-15 DIAGNOSIS — H8112 Benign paroxysmal vertigo, left ear: Secondary | ICD-10-CM | POA: Diagnosis not present

## 2021-09-15 DIAGNOSIS — R42 Dizziness and giddiness: Secondary | ICD-10-CM | POA: Diagnosis not present

## 2021-09-15 DIAGNOSIS — R2681 Unsteadiness on feet: Secondary | ICD-10-CM | POA: Diagnosis not present

## 2021-09-15 NOTE — Therapy (Signed)
?OUTPATIENT PHYSICAL THERAPY VESTIBULAR TREATMENT NOTE ? ? ?Patient Name: [name] [name] ?MRN: [id number] ?DOB:1941-02-07, 81 y.o., female ?Today's Date: 09/15/2021 ? ?PCP: [name], MD ?REFERRING PROVIDER: [name], MD ? ? PT End of Session - 09/15/21 1107   ? ? Visit Number 2   ? Number of Visits 9   ? Date for PT Re-Evaluation 11/08/21   ? Authorization Type [employer]   ? PT Start Time 1017   ? PT Stop Time 1052   ended session early due to emesis  ? PT Time Calculation (min) 35 min   ? Activity Tolerance Other (comment);Patient tolerated treatment well   limited by dizziness and emesis  ? Behavior During Therapy [facility] for tasks assessed/performed;Agitated   ? ?  ?  ? ?  ? ? ?Past Medical History:  ?Diagnosis Date  ? Arthritis   ? Dizziness   ? Esophageal stricture   ? Gastroesophageal reflux disease   ? Hiatal hernia   ? Hiatal hernia   ? Varicose veins   ? Vertigo   ? chronic with exacerbation  ? ?Past Surgical History:  ?Procedure Laterality Date  ? BLADDER SUSPENSION    ? x 2  ? ENDOVENOUS ABLATION SAPHENOUS VEIN W/ LASER Right 09-05-2013  ? right greater saphenous vein by Gretta Began MD  ? ENDOVENOUS ABLATION SAPHENOUS VEIN W/ LASER Left 10-18-2013  ? endovenous laser ablation left greater saphenous vein by Gretta Began MD  ? VAGINAL HYSTERECTOMY    ? ?Patient Active Problem List  ? Diagnosis Date Noted  ? Acute metabolic encephalopathy 08/13/2021  ? Memory loss 08/13/2021  ? UTI (urinary tract infection) 08/13/2021  ? Varicose veins of lower extremities with other complications 05/11/2013  ? Swelling of limb 05/11/2013  ? Pure hypercholesterolemia 06/07/2011  ? ALLERGIC RHINITIS 06/26/2007  ? GASTROESOPHAGEAL REFLUX DISEASE 06/26/2007  ? Diaphragmatic hernia 06/26/2007  ? VERTIGO 06/26/2007  ? DYSPHAGIA UNSPECIFIED 06/26/2007  ? ESOPHAGEAL STRICTURE 05/03/2007  ? ? ?ONSET DATE: 08/15/2021 (date of referral) ? ?REFERRING DIAG: R42 (ICD-10-CM) - Dizziness and giddiness  ? ?THERAPY DIAG:  ?BPPV  (benign paroxysmal positional vertigo), right ? ?Dizziness and giddiness ? ?PERTINENT HISTORY: medical history significant of GERD, memory issues.  Pt presented to ED on 08/13/21  with increased falls, change in behavior, and worsening confusion.  MRI brain negative for acute CVA.  Work-up revealed acute metabolic encephalopathy secondary to presumptive UTI.  ?  ?Per audiology note from 02/2021 from Dr. Zella Ball: ?Mrs. Weyland exhibits a high-frequency vestibular ocular reflex deficit bilaterally as measured on vHIT testing. This bilateral deficit is of unclear etiology. She denies ever being treated with IV antibiotics or antineoplastic medications. 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  ? ?PRECAUTIONS: Other: Fall, Chronic dizziness ? ?SUBJECTIVE: Reports she was a little dizzy after the eval last visit, but went away a couple hours later. Reports feeling good today. Willing to try the  Epley today in order to get the crystals back in place.  ? ?PAIN: Are you having pain?  No ?  ? ? ? ?OBJECTIVE:  ?mCTSIB: conditions 1-3: 30 seconds, condition 4: 5 seconds ? ? ?POSITIONAL TESTS: ? ?Right Dix-Hallpike: upbeating, right nystagmus; Duration:~10 seconds   ? ?VESTIBULAR TREATMENT: ? ?Canalith Repositioning: ?Epley Right: Number of Reps: 2, Response to Treatment: symptoms improved, and Comment: Pt reporting feeling much better after the 2nd rep.  ?   ? ? ?  PATIENT EDUCATION: ?Education details: Continued to answer pt's questions on what causes BPPV and how it is treated via the Epley maneuver and that it may take a couple of attempts to treat and that pt may not feel the best after today's tx. Results of mCTSIB and decr vestibular input for pt's balance. Educated to sleep on L side.  ?Person educated: Patient and Spouse ?Education method: Explanation ?Education comprehension: verbalized understanding  and needs further education ?  ?  ?GOALS: ?Goals reviewed with patient? Yes ?  ?SHORT TERM GOALS: ALL STGS = LTGS ?  ?  ?  ?LONG TERM GOALS: Target date: 10/07/2021 ?  ?  ?Pt will be independent with initial HEP for vestibular/balance deficits.  ?Baseline:  ?Goal status: INITIAL ?  ?2.  Pt will demo resolution of R posterior canal BPPV in order to decr dizziness/unsteadiness for improved functional mobility.  ?Baseline:  ?Goal status: INITIAL ?  ?3.  Pt will improve condition 4 of mCTSIB to at least 12 seconds in order to demo improved vestibular input for balance.  ?Baseline: conditions 1-3: 30 seconds, condition 4: 5 seconds ?Goal status: REVISED ?  ?4.  Pt will undergo further testing of FGA with goal written if appropriate. ?Baseline:  ?Goal status: INITIAL ?  ?5.  Pt will improve DPS to at least a 58 in order to demo improved functional outcomes related to dizziness.  ?Baseline: 50 ?Goal status: INITIAL ?  ?  ?  ?ASSESSMENT: ?  ?CLINICAL IMPRESSION: ?Pt agreeable to be treated via Epley maneuver for  R posterior canalithiasis. Pt with R upbeating rotary nystagmus lasting ~10 seconds. Pt went through 2 reps of the Epley maneuver with pt reporting feeling much better after the 2nd rep. Pt did not want to be re-assessed afterwards. Performed the mCTSIB with pt only able to hold condition 4 for 5 seconds indicating decr vestibular input for balance. LTG updated. Session limited due to pt having a brief episode of emesis at the end. Pt's husband reports that she did not eat anything for breakfast this morning. Pt felt better afterwards, but just a little woozy.  ?  ?OBJECTIVE IMPAIRMENTS Abnormal gait, decreased activity tolerance, decreased balance, difficulty walking, and dizziness.  ?  ?ACTIVITY LIMITATIONS cleaning, community activity, driving, yard work, and play with her grandkids .  ?  ?PERSONAL FACTORS Age, Behavior pattern, and Past/current experiences are also affecting patient's functional outcome.  ?  ?   ?REHAB POTENTIAL: Fair due to chronicity of condition, pt declining tx for BPPV at eval (and hx of declining tx for vestibular deficits/BPPV)  ?  ?CLINICAL DECISION MAKING: Evolving/moderate complexity ?  ?EVALUATION COMPLEXITY: Moderate ?  ?  ?PLAN: ?PT FREQUENCY: 2x/week ?  ?PT DURATION: 8 weeks ?  ?PLANNED INTERVENTIONS: Therapeutic exercises, Therapeutic activity, Neuromuscular re-education, Balance training, Gait training, Patient/Family education, Vestibular training, and Canalith repositioning ?  ?PLAN FOR NEXT SESSION: Continue to perform Epley for R posterior canalithiasis if pt allows.Might also have to assess L post canal BPPV. Further assess FGA if able. Initial HEP for VOR x1.  ?  ? ? ?Drake Leach, PT, DPT ?09/15/2021, 11:08 AM ? ? ?   ? ?

## 2021-09-18 ENCOUNTER — Encounter: Payer: Self-pay | Admitting: Physical Therapy

## 2021-09-18 ENCOUNTER — Ambulatory Visit: Payer: Medicare PPO | Admitting: Physical Therapy

## 2021-09-18 DIAGNOSIS — H8112 Benign paroxysmal vertigo, left ear: Secondary | ICD-10-CM | POA: Diagnosis not present

## 2021-09-18 DIAGNOSIS — H8111 Benign paroxysmal vertigo, right ear: Secondary | ICD-10-CM

## 2021-09-18 DIAGNOSIS — R42 Dizziness and giddiness: Secondary | ICD-10-CM

## 2021-09-18 DIAGNOSIS — R2681 Unsteadiness on feet: Secondary | ICD-10-CM

## 2021-09-18 NOTE — Therapy (Signed)
?OUTPATIENT PHYSICAL THERAPY VESTIBULAR TREATMENT NOTE ? ? ?Patient Name: [name] [name] ?MRN: [id number] ?DOB:02-01-1941, 81 y.o., female ?Today's Date: 09/18/2021 ? ?PCP: [name], MD ?REFERRING PROVIDER: [name], MD ? ? PT End of Session - 09/18/21 0931   ? ? Visit Number 3   ? Number of Visits 9   ? Date for PT Re-Evaluation 11/08/21   ? Authorization Type [employer]   ? PT Start Time 0930   ? PT Stop Time 1016   full time not used for billing due to rest breaks needed due to dizziness  ? PT Time Calculation (min) 46 min   ? Activity Tolerance Other (comment);Patient tolerated treatment well   limited by dizziness and emesis  ? Behavior During Therapy [facility] for tasks assessed/performed;Agitated   ? ?  ?  ? ?  ? ? ? ?Past Medical History:  ?Diagnosis Date  ? Arthritis   ? Dizziness   ? Esophageal stricture   ? Gastroesophageal reflux disease   ? Hiatal hernia   ? Hiatal hernia   ? Varicose veins   ? Vertigo   ? chronic with exacerbation  ? ?Past Surgical History:  ?Procedure Laterality Date  ? BLADDER SUSPENSION    ? x 2  ? ENDOVENOUS ABLATION SAPHENOUS VEIN W/ LASER Right 09-05-2013  ? right greater saphenous vein by Gretta Began MD  ? ENDOVENOUS ABLATION SAPHENOUS VEIN W/ LASER Left 10-18-2013  ? endovenous laser ablation left greater saphenous vein by Gretta Began MD  ? VAGINAL HYSTERECTOMY    ? ?Patient Active Problem List  ? Diagnosis Date Noted  ? Acute metabolic encephalopathy 08/13/2021  ? Memory loss 08/13/2021  ? UTI (urinary tract infection) 08/13/2021  ? Varicose veins of lower extremities with other complications 05/11/2013  ? Swelling of limb 05/11/2013  ? Pure hypercholesterolemia 06/07/2011  ? ALLERGIC RHINITIS 06/26/2007  ? GASTROESOPHAGEAL REFLUX DISEASE 06/26/2007  ? Diaphragmatic hernia 06/26/2007  ? VERTIGO 06/26/2007  ? DYSPHAGIA UNSPECIFIED 06/26/2007  ? ESOPHAGEAL STRICTURE 05/03/2007  ? ? ?ONSET DATE: 08/15/2021 (date of referral) ? ?REFERRING DIAG: R42 (ICD-10-CM) - Dizziness  and giddiness  ? ?THERAPY DIAG:  ?BPPV (benign paroxysmal positional vertigo), right ? ?Dizziness and giddiness ? ?Unsteadiness on feet ? ?PERTINENT HISTORY: medical history significant of GERD, memory issues.  Pt presented to ED on 08/13/21  with increased falls, change in behavior, and worsening confusion.  MRI brain negative for acute CVA.  Work-up revealed acute metabolic encephalopathy secondary to presumptive UTI.  ?  ?Per audiology note from 02/2021 from Dr. Zella Ball: ?Mrs. Shults exhibits a high-frequency vestibular ocular reflex deficit bilaterally as measured on vHIT testing. This bilateral deficit is of unclear etiology. She denies ever being treated with IV antibiotics or antineoplastic medications. 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  ? ?PRECAUTIONS: Other: Fall, Chronic dizziness ? ?SUBJECTIVE: Tuesday wasn't the best after canalith repositioning, but is feeling better and that the dizziness isn't as bad.  ? ?PAIN: Are you having pain?  No ?  ? ? ? ?OBJECTIVE: ? ? ?POSITIONAL TESTS: ? ?Right Dix-Hallpike: upbeating, right nystagmus; Duration:~10 seconds   - assessed 2 times throughout session. Pt stating "make the dizziness go away" but is agreeable to go through Epley maneuver  ? ?VESTIBULAR TREATMENT: ? ?Canalith Repositioning: ?Epley Right: Number of Reps: 2, Response to Treatment: comment: pt with incr dizziness when coming up to sitting after 1st rep and  needing ~10 minute rest break, and Comment: after 2nd rep pt still feeling dizzy and having an episode of emesis, but feeling less dizzy and better afterwards.  ?   ? ? ?PATIENT EDUCATION: ?Education details: Continued to answer pt's questions on what causes BPPV and how it is treated via the Epley maneuver and that it may take a couple of attempts to treat and that pt may not feel the best after today's tx. And that pt will  feel dizzy before she feels better. Provided handout from [employer] about BPPV and went over with pt and pt's husband ?Person educated: Patient and Spouse ?Education method: Explanation and Handout  ?Education comprehension: verbalized understanding  ?  ?GOALS: ?Goals reviewed with patient? Yes ?  ?SHORT TERM GOALS: ALL STGS = LTGS ?  ?  ?  ?LONG TERM GOALS: Target date: 10/07/2021 ?  ?  ?Pt will be independent with initial HEP for vestibular/balance deficits.  ?Baseline:  ?Goal status: INITIAL ?  ?2.  Pt will demo resolution of R posterior canal BPPV in order to decr dizziness/unsteadiness for improved functional mobility.  ?Baseline:  ?Goal status: INITIAL ?  ?3.  Pt will improve condition 4 of mCTSIB to at least 12 seconds in order to demo improved vestibular input for balance.  ?Baseline: conditions 1-3: 30 seconds, condition 4: 5 seconds ?Goal status: REVISED ?  ?4.  Pt will undergo further testing of FGA with goal written if appropriate. ?Baseline:  ?Goal status: INITIAL ?  ?5.  Pt will improve DPS to at least a 58 in order to demo improved functional outcomes related to dizziness.  ?Baseline: 50 ?Goal status: INITIAL ?  ?  ?  ?ASSESSMENT: ?  ?CLINICAL IMPRESSION: ?Pt agreeable to be treated via Epley maneuver again for  R posterior canalithiasis after positive with Gilberto Better. Pt with R upbeating rotary nystagmus lasting ~10 seconds. After 1st rep, pt with incr dizziness and needing a prolonged seated rest break. When assessed again, pt still demonstrating R posterior canalithiasis and performed the Epley again. Pt with less dizziness after the 2nd time, but did have an episode of emesis afterwards. Pt feeling better at end of session. Continued to provide education on what BPPV is and what needs to be done for it to be treated. Will continue to progress towards LTGs.  ?  ?OBJECTIVE IMPAIRMENTS Abnormal gait, decreased activity tolerance, decreased balance, difficulty walking, and dizziness.  ?  ?ACTIVITY  LIMITATIONS cleaning, community activity, driving, yard work, and play with her grandkids .  ?  ?PERSONAL FACTORS Age, Behavior pattern, and Past/current experiences are also affecting patient's functional outcome.  ?  ?  ?REHAB POTENTIAL: Fair due to chronicity of condition, pt declining tx for BPPV at eval (and hx of declining tx for vestibular deficits/BPPV)  ?  ?CLINICAL DECISION MAKING: Evolving/moderate complexity ?  ?EVALUATION COMPLEXITY: Moderate ?  ?  ?PLAN: ?PT FREQUENCY: 2x/week ?  ?PT DURATION: 8 weeks ?  ?PLANNED INTERVENTIONS: Therapeutic exercises, Therapeutic activity, Neuromuscular re-education, Balance training, Gait training, Patient/Family education, Vestibular training, and Canalith repositioning ?  ?PLAN FOR NEXT SESSION: Continue to perform Epley for R posterior canalithiasis if pt allows. Might have to try Semont or using vibration to clear it. Further assess FGA if able. Initial HEP for VOR x1.  ?  ? ? ?Drake Leach, PT, DPT ?09/18/2021, 12:02 PM ? ? ?   ? ?

## 2021-09-21 ENCOUNTER — Ambulatory Visit: Payer: Medicare PPO | Admitting: Physical Therapy

## 2021-09-21 ENCOUNTER — Encounter: Payer: Self-pay | Admitting: Physical Therapy

## 2021-09-21 DIAGNOSIS — H8111 Benign paroxysmal vertigo, right ear: Secondary | ICD-10-CM | POA: Diagnosis not present

## 2021-09-21 DIAGNOSIS — R2681 Unsteadiness on feet: Secondary | ICD-10-CM

## 2021-09-21 DIAGNOSIS — R42 Dizziness and giddiness: Secondary | ICD-10-CM

## 2021-09-21 DIAGNOSIS — H8112 Benign paroxysmal vertigo, left ear: Secondary | ICD-10-CM | POA: Diagnosis not present

## 2021-09-21 NOTE — Therapy (Signed)
?OUTPATIENT PHYSICAL THERAPY VESTIBULAR TREATMENT NOTE ? ? ?Patient Name: [name] [name] ?MRN: [id number] ?DOB:July 31, 1940, 81 y.o., female ?Today's Date: 09/21/2021 ? ?PCP: [name], MD ?REFERRING PROVIDER: [name], MD ? ? PT End of Session - 09/21/21 0841   ? ? Visit Number 4   ? Number of Visits 9   ? Date for PT Re-Evaluation 11/08/21   ? Authorization Type [employer]   ? PT Start Time 0840   ? PT Stop Time [phone]   full time not used for billing due to CRM  ? PT Time Calculation (min) 42 min   ? Activity Tolerance Patient tolerated treatment well   ? Behavior During Therapy [facility] for tasks assessed/performed   ? ?  ?  ? ?  ? ? ? ?Past Medical History:  ?Diagnosis Date  ? Arthritis   ? Dizziness   ? Esophageal stricture   ? Gastroesophageal reflux disease   ? Hiatal hernia   ? Hiatal hernia   ? Varicose veins   ? Vertigo   ? chronic with exacerbation  ? ?Past Surgical History:  ?Procedure Laterality Date  ? BLADDER SUSPENSION    ? x 2  ? ENDOVENOUS ABLATION SAPHENOUS VEIN W/ LASER Right 09-05-2013  ? right greater saphenous vein by Gretta Began MD  ? ENDOVENOUS ABLATION SAPHENOUS VEIN W/ LASER Left 10-18-2013  ? endovenous laser ablation left greater saphenous vein by Gretta Began MD  ? VAGINAL HYSTERECTOMY    ? ?Patient Active Problem List  ? Diagnosis Date Noted  ? Acute metabolic encephalopathy 08/13/2021  ? Memory loss 08/13/2021  ? UTI (urinary tract infection) 08/13/2021  ? Varicose veins of lower extremities with other complications 05/11/2013  ? Swelling of limb 05/11/2013  ? Pure hypercholesterolemia 06/07/2011  ? ALLERGIC RHINITIS 06/26/2007  ? GASTROESOPHAGEAL REFLUX DISEASE 06/26/2007  ? Diaphragmatic hernia 06/26/2007  ? VERTIGO 06/26/2007  ? DYSPHAGIA UNSPECIFIED 06/26/2007  ? ESOPHAGEAL STRICTURE 05/03/2007  ? ? ?ONSET DATE: 08/15/2021 (date of referral) ? ?REFERRING DIAG: R42 (ICD-10-CM) - Dizziness and giddiness  ? ?THERAPY DIAG:  ?BPPV (benign paroxysmal positional vertigo),  right ? ?Unsteadiness on feet ? ?Dizziness and giddiness ? ?PERTINENT HISTORY: medical history significant of GERD, memory issues.  Pt presented to ED on 08/13/21  with increased falls, change in behavior, and worsening confusion.  MRI brain negative for acute CVA.  Work-up revealed acute metabolic encephalopathy secondary to presumptive UTI.  ?  ?Per audiology note from 02/2021 from Dr. Zella Ball: ?Mrs. Bradshaw exhibits a high-frequency vestibular ocular reflex deficit bilaterally as measured on vHIT testing. This bilateral deficit is of unclear etiology. She denies ever being treated with IV antibiotics or antineoplastic medications. 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  ? ?PRECAUTIONS: Other: Fall, Chronic dizziness ? ?SUBJECTIVE: Slept most of the day on Friday after last session. "Dizziness is still hanging in there"  ? ?PAIN: Are you having pain?  No ?  ? ? ? ?OBJECTIVE: ? ? ?POSITIONAL TESTS: ? ?Right Dix-Hallpike: upbeating, right nystagmus; Duration:~10-15 seconds.  ?Right Roll Test: none; Duration: no dizziness ?Left Roll Test: none; Duration: no dizzines ?Right Sidelying: upbeating, right nystagmus; Duration: ~10 seconds. During last rep, pt with less strong of an intensity and only lasting ~5-10 seconds    ? ?During R dix hallpike and R sidelying test, pt yelling "make the dizziness stop" and pt wants to close her eyes. Pt reporting less dizziness  during last assessment of of R sidelying test.  ? ? ?VESTIBULAR TREATMENT: ? ?Canalith Repositioning: ?Semont Right Posterior: Number of Reps: 3, Response to Treatment: symptoms improved, and Comment: Pt reporting feeling better after the last 2 treatments    ?   ? ? ?PATIENT EDUCATION: ?Education details: Continued to answer pt's questions (pt asks the same questions each session) on what causes BPPV and purpose of trying a new maneuver  today as the Epley has not ben successful. And purpose of treating it with CRM.  ?Person educated: Patient and Spouse ?Education method: Explanation  ?Education comprehension: verbalized understanding  ?  ?GOALS: ?Goals reviewed with patient? Yes ?  ?SHORT TERM GOALS: ALL STGS = LTGS ?  ?  ?  ?LONG TERM GOALS: Target date: 10/07/2021 ?  ?  ?Pt will be independent with initial HEP for vestibular/balance deficits.  ?Baseline:  ?Goal status: INITIAL ?  ?2.  Pt will demo resolution of R posterior canal BPPV in order to decr dizziness/unsteadiness for improved functional mobility.  ?Baseline:  ?Goal status: INITIAL ?  ?3.  Pt will improve condition 4 of mCTSIB to at least 12 seconds in order to demo improved vestibular input for balance.  ?Baseline: conditions 1-3: 30 seconds, condition 4: 5 seconds ?Goal status: REVISED ?  ?4.  Pt will undergo further testing of FGA with goal written if appropriate. ?Baseline:  ?Goal status: INITIAL ?  ?5.  Pt will improve DPS to at least a 58 in order to demo improved functional outcomes related to dizziness.  ?Baseline: 50 ?Goal status: INITIAL ?  ?  ?  ?ASSESSMENT: ?  ?CLINICAL IMPRESSION: ?Pt continues with + R Dix Hallpike test. Since the Epley maneuver has not been successful in previous sessions, tried the Semont maneuver today. When re-assessed in R sidelying after the 1st two reps, pt with less intensity and less duration of nystagmus and pt also reporting not feeling as dizzy. Pt reporting feeling better after the last 2 reps of the Semont maneuver. No emesis today afterwards.  At start of session, also assessed horizontal canals as they were not assessed at eval, both were negative and pt had no dizziness or nystagmus. Will continue to progress towards LTGs.  ?  ?OBJECTIVE IMPAIRMENTS Abnormal gait, decreased activity tolerance, decreased balance, difficulty walking, and dizziness.  ?  ?ACTIVITY LIMITATIONS cleaning, community activity, driving, yard work, and play with her  grandkids .  ?  ?PERSONAL FACTORS Age, Behavior pattern, and Past/current experiences are also affecting patient's functional outcome.  ?  ?  ?REHAB POTENTIAL: Fair due to chronicity of condition, pt declining tx for BPPV at eval (and hx of declining tx for vestibular deficits/BPPV)  ?  ?CLINICAL DECISION MAKING: Evolving/moderate complexity ?  ?EVALUATION COMPLEXITY: Moderate ?  ?  ?PLAN: ?PT FREQUENCY: 2x/week ?  ?PT DURATION: 8 weeks ?  ?PLANNED INTERVENTIONS: Therapeutic exercises, Therapeutic activity, Neuromuscular re-education, Balance training, Gait training, Patient/Family education, Vestibular training, and Canalith repositioning ?  ?PLAN FOR NEXT SESSION: Try Semont again or using vibration to clear it. Also assess L sidelying just to make sure that canal is cleared. Further assess FGA if able. Initial HEP for VOR x1.  ?  ? ? ?Drake Leach, PT, DPT ?09/21/2021, 10:13 AM ? ? ?   ? ?

## 2021-09-23 ENCOUNTER — Ambulatory Visit: Payer: Medicare PPO | Admitting: Physical Therapy

## 2021-09-23 ENCOUNTER — Encounter: Payer: Self-pay | Admitting: Physical Therapy

## 2021-09-23 DIAGNOSIS — H8112 Benign paroxysmal vertigo, left ear: Secondary | ICD-10-CM | POA: Diagnosis not present

## 2021-09-23 DIAGNOSIS — H8111 Benign paroxysmal vertigo, right ear: Secondary | ICD-10-CM

## 2021-09-23 DIAGNOSIS — R42 Dizziness and giddiness: Secondary | ICD-10-CM

## 2021-09-23 DIAGNOSIS — R2681 Unsteadiness on feet: Secondary | ICD-10-CM | POA: Diagnosis not present

## 2021-09-23 NOTE — Therapy (Signed)
?OUTPATIENT PHYSICAL THERAPY VESTIBULAR TREATMENT NOTE ? ? ?Patient Name: [name] [name] ?MRN: [id number] ?DOB:05-30-1941, 81 y.o., female ?Today's Date: 09/23/2021 ? ?PCP: Shirline Frees, MD ?REFERRING PROVIDER: Shirline Frees, MD ? ? PT End of Session - 09/23/21 0929   ? ? Visit Number 5   ? Number of Visits 9   ? Date for PT Re-Evaluation 11/08/21   ? Authorization Type [employer]   ? PT Start Time [phone]   ? PT Stop Time 1008   ? PT Time Calculation (min) 40 min   ? Activity Tolerance Patient tolerated treatment well   limited by emesis  ? Behavior During Therapy [facility] for tasks assessed/performed   ? ?  ?  ? ?  ? ? ? ?Past Medical History:  ?Diagnosis Date  ? Arthritis   ? Dizziness   ? Esophageal stricture   ? Gastroesophageal reflux disease   ? Hiatal hernia   ? Hiatal hernia   ? Varicose veins   ? Vertigo   ? chronic with exacerbation  ? ?Past Surgical History:  ?Procedure Laterality Date  ? BLADDER SUSPENSION    ? x 2  ? ENDOVENOUS ABLATION SAPHENOUS VEIN W/ LASER Right 09-05-2013  ? right greater saphenous vein by Curt Jews MD  ? ENDOVENOUS ABLATION SAPHENOUS VEIN W/ LASER Left 10-18-2013  ? endovenous laser ablation left greater saphenous vein by Curt Jews MD  ? VAGINAL HYSTERECTOMY    ? ?Patient Active Problem List  ? Diagnosis Date Noted  ? Acute metabolic encephalopathy 99991111  ? Memory loss 08/13/2021  ? UTI (urinary tract infection) 08/13/2021  ? Varicose veins of lower extremities with other complications [id number]  ? Swelling of limb 05/11/2013  ? Pure hypercholesterolemia 06/07/2011  ? ALLERGIC RHINITIS 06/26/2007  ? GASTROESOPHAGEAL REFLUX DISEASE 06/26/2007  ? Diaphragmatic hernia 06/26/2007  ? VERTIGO 06/26/2007  ? DYSPHAGIA UNSPECIFIED 06/26/2007  ? ESOPHAGEAL STRICTURE 05/03/2007  ? ? ?ONSET DATE: 08/15/2021 (date of referral) ? ?REFERRING DIAG: R42 (ICD-10-CM) - Dizziness and giddiness  ? ?THERAPY DIAG:  ?BPPV (benign paroxysmal positional vertigo), right ? ?Unsteadiness on  feet ? ?Dizziness and giddiness ? ?PERTINENT HISTORY: medical history significant of GERD, memory issues.  Pt presented to ED on 08/13/21  with increased falls, change in behavior, and worsening confusion.  MRI brain negative for acute CVA.  Work-up revealed acute metabolic encephalopathy secondary to presumptive UTI.  ?  ?Per audiology note from 02/2021 from Dr. Rachell Cipro: ?Mrs. Hefty exhibits a high-frequency vestibular ocular reflex deficit bilaterally as measured on vHIT testing. This bilateral deficit is of unclear etiology. She denies ever being treated with IV antibiotics or antineoplastic medications. 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  ? ?PRECAUTIONS: Other: Fall, Chronic dizziness ? ?SUBJECTIVE: Reports that she is feeling much better. Has not had any dizziness.  ? ?PAIN: Are you having pain?  No ?  ? ? ? ?OBJECTIVE: ? ? ?POSITIONAL TESTS: ? ?Right Sidelying: upbeating, right nystagmus; Duration: incr latency period, pt with less strong of an intensity and only lasting ~10 seconds when assessed first 2 reps  ?Left Sidelying: none; Duration: pt reporting mild dizziness, but no nystagmus     ?Marland Kitchen  ?When re-assessing R sidelying at end of session after performing Semont with vibration, pt with incr intensity of R upbeating nystagmus and pt needing to sit up due to having an episode of emesis.  ? ?VESTIBULAR TREATMENT: ? ?Canalith  Repositioning: ?Semont Right Posterior: Number of Reps: 2, Response to Treatment: symptoms improved, and Comment: During 2nd rep, utilized vibration in each position on pt's mastoid for 45 seconds. Pt reporting feeling better and having less dizziness afterwards   Pt's husband available to help with bringing pt's legs on and off the mat table.  ?   ? ? ?PATIENT EDUCATION: ?Education details: Continued to answer pt's questions on what causes BPPV and purpose of  trying with vibration to help with lingering crystals. Educated on what causes nystagmus and showed pt video of what it looks like. And purpose of treating it with CRM.  ?Person educated: Patient and Spouse ?Education method: Explanation  ?Education comprehension: verbalized understanding  ?  ?GOALS: ?Goals reviewed with patient? Yes ?  ?SHORT TERM GOALS: ALL STGS = LTGS ?  ?  ?  ?LONG TERM GOALS: Target date: 10/07/2021 ?  ?  ?Pt will be independent with initial HEP for vestibular/balance deficits.  ?Baseline:  ?Goal status: INITIAL ?  ?2.  Pt will demo resolution of R posterior canal BPPV in order to decr dizziness/unsteadiness for improved functional mobility.  ?Baseline:  ?Goal status: INITIAL ?  ?3.  Pt will improve condition 4 of mCTSIB to at least 12 seconds in order to demo improved vestibular input for balance.  ?Baseline: conditions 1-3: 30 seconds, condition 4: 5 seconds ?Goal status: REVISED ?  ?4.  Pt will undergo further testing of FGA with goal written if appropriate. ?Baseline:  ?Goal status: INITIAL ?  ?5.  Pt will improve DPS to at least a 58 in order to demo improved functional outcomes related to dizziness.  ?Baseline: 50 ?Goal status: INITIAL ?  ?  ?  ?ASSESSMENT: ?  ?CLINICAL IMPRESSION: ?Pt continued with + R sidelying test with R upbeating rotary nystagmus, indicating continued R posterior canalithiasis. Pt with an incr latency period with nystagmus today and was less with intensity and duration as well as pt not feeling as dizzy. Performed with Semont maneuver x2 reps with 2nd rep utilized vibration on mastoid for 45 seconds. When re-assessed after last rep of Semont, pt with incr intensity of nystagmus and needing to sit up due to having an episode of emesis. Pt felt ok at the end of the session with prolonged seated rest break and was able to ambulate out with no difficulties. Will  continue per POC.  ?  ?OBJECTIVE IMPAIRMENTS Abnormal gait, decreased activity tolerance, decreased balance,  difficulty walking, and dizziness.  ?  ?ACTIVITY LIMITATIONS cleaning, community activity, driving, yard work, and play with her grandkids .  ?  ?PERSONAL FACTORS Age, Behavior pattern, and Past/current experiences are also affecting patient's functional outcome.  ?  ?  ?REHAB POTENTIAL: Fair due to chronicity of condition, pt declining tx for BPPV at eval (and hx of declining tx for vestibular deficits/BPPV)  ?  ?CLINICAL DECISION MAKING: Evolving/moderate complexity ?  ?EVALUATION COMPLEXITY: Moderate ?  ?  ?PLAN: ?PT FREQUENCY: 2x/week ?  ?PT DURATION: 8 weeks ?  ?PLANNED INTERVENTIONS: Therapeutic exercises, Therapeutic activity, Neuromuscular re-education, Balance training, Gait training, Patient/Family education, Vestibular training, and Canalith repositioning ?  ?PLAN FOR NEXT SESSION: Try Semont again or using vibration to clear it. Try going faster this time. Further assess FGA if able. Initial HEP for VOR x1.  ?  ? ? ?Arliss Journey, PT, DPT ?09/23/2021, 11:57 AM ? ? ?   ? ?

## 2021-09-28 ENCOUNTER — Encounter: Payer: Self-pay | Admitting: Physical Therapy

## 2021-09-28 ENCOUNTER — Ambulatory Visit (AMBULATORY_SURGERY_CENTER): Payer: Medicare PPO | Admitting: *Deleted

## 2021-09-28 ENCOUNTER — Ambulatory Visit: Payer: Medicare PPO | Attending: Internal Medicine | Admitting: Physical Therapy

## 2021-09-28 VITALS — Ht 63.0 in | Wt 143.0 lb

## 2021-09-28 DIAGNOSIS — R1319 Other dysphagia: Secondary | ICD-10-CM

## 2021-09-28 DIAGNOSIS — H8111 Benign paroxysmal vertigo, right ear: Secondary | ICD-10-CM | POA: Insufficient documentation

## 2021-09-28 DIAGNOSIS — R42 Dizziness and giddiness: Secondary | ICD-10-CM | POA: Insufficient documentation

## 2021-09-28 DIAGNOSIS — R2681 Unsteadiness on feet: Secondary | ICD-10-CM | POA: Diagnosis not present

## 2021-09-28 NOTE — Therapy (Signed)
?OUTPATIENT PHYSICAL THERAPY VESTIBULAR TREATMENT NOTE ? ? ?Patient Name: [name] [name] ?MRN: [id number] ?DOB:08/16/1940, 81 y.o., female ?Today's Date: 09/28/2021 ? ?PCP: Shirline Frees, MD ?REFERRING PROVIDER: Shirline Frees, MD ? ? PT End of Session - 09/28/21 [id number]   ? ? Visit Number 6   ? Number of Visits 9   ? Date for PT Re-Evaluation 11/08/21   ? Authorization Type [employer]   ? PT Start Time [phone]   ? PT Stop Time 1005   full time not billable to due CRM  ? PT Time Calculation (min) 43 min   ? Activity Tolerance Patient tolerated treatment well   ? Behavior During Therapy [facility] for tasks assessed/performed   ? ?  ?  ? ?  ? ? ? ?Past Medical History:  ?Diagnosis Date  ? Arthritis   ? Dizziness   ? Esophageal stricture   ? Gastroesophageal reflux disease   ? Hiatal hernia   ? Hiatal hernia   ? Varicose veins   ? Vertigo   ? chronic with exacerbation  ? ?Past Surgical History:  ?Procedure Laterality Date  ? BLADDER SUSPENSION    ? x 2  ? ENDOVENOUS ABLATION SAPHENOUS VEIN W/ LASER Right 09-05-2013  ? right greater saphenous vein by Curt Jews MD  ? ENDOVENOUS ABLATION SAPHENOUS VEIN W/ LASER Left 10-18-2013  ? endovenous laser ablation left greater saphenous vein by Curt Jews MD  ? VAGINAL HYSTERECTOMY    ? ?Patient Active Problem List  ? Diagnosis Date Noted  ? Acute metabolic encephalopathy 99991111  ? Memory loss 08/13/2021  ? UTI (urinary tract infection) 08/13/2021  ? Varicose veins of lower extremities with other complications [id number]  ? Swelling of limb 05/11/2013  ? Pure hypercholesterolemia 06/07/2011  ? ALLERGIC RHINITIS 06/26/2007  ? GASTROESOPHAGEAL REFLUX DISEASE 06/26/2007  ? Diaphragmatic hernia 06/26/2007  ? VERTIGO 06/26/2007  ? DYSPHAGIA UNSPECIFIED 06/26/2007  ? ESOPHAGEAL STRICTURE 05/03/2007  ? ? ?ONSET DATE: 08/15/2021 (date of referral) ? ?REFERRING DIAG: R42 (ICD-10-CM) - Dizziness and giddiness  ? ?THERAPY DIAG:  ?BPPV (benign paroxysmal positional vertigo), right ? ?Unsteadiness  on feet ? ?Dizziness and giddiness ? ?PERTINENT HISTORY: medical history significant of GERD, memory issues.  Pt presented to ED on 08/13/21  with increased falls, change in behavior, and worsening confusion.  MRI brain negative for acute CVA.  Work-up revealed acute metabolic encephalopathy secondary to presumptive UTI.  ?  ?Per audiology note from 02/2021 from Dr. Rachell Cipro: ?Mrs. Kareem exhibits a high-frequency vestibular ocular reflex deficit bilaterally as measured on vHIT testing. This bilateral deficit is of unclear etiology. She denies ever being treated with IV antibiotics or antineoplastic medications. 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  ? ?PRECAUTIONS: Other: Fall, Chronic dizziness ? ?SUBJECTIVE: Reports that she is still having some dizziness, but it has gotten better.  ? ?PAIN: Are you having pain?  No ?  ? ? ? ?OBJECTIVE: ? ? ?POSITIONAL TESTS: ? ?Right Sidelying: upbeating, right nystagmus; Duration: incr latency period esp during 2nd and 3rd re-assessment (about 10 seconds until nystagmus starts), however pt with less strong of an intensity and only lasting ~10 seconds, pt reporting dizziness is not as bad in this position. During last assessment, nystagmus only lasting about 8 seconds.  ? ?.  ? ?VESTIBULAR TREATMENT: ? ?Canalith Repositioning: ?Semont Right Posterior: Number of Reps: 3, Response to Treatment: symptoms improved, and Comment: utilized vibration  in each position on pt's mastoid for 45 seconds- 1 minute. Pt reporting feeling better and having less dizziness afterwards, but still some. No spinning sensation  Pt's husband available to help with bringing pt's legs on and off the mat table.  ?   ? ? ?PATIENT EDUCATION: ?Education details: Continued to answer pt's questions on what causes BPPV and purpose of trying with vibration to help with maneuvers. Adding more  appts to POC due to it taking a while to clear BPPV so that therapy can continue to work on pt's vestibular deficits and balance.  ?Person educated: Patient and Spouse ?Education method: Explanation  ?Education comprehension: verbalized understanding  ?  ?GOALS: ?Goals reviewed with patient? Yes ?  ?SHORT TERM GOALS: ALL STGS = LTGS ?  ?  ?  ?LONG TERM GOALS: Target date: 10/07/2021 ?  ?  ?Pt will be independent with initial HEP for vestibular/balance deficits.  ?Baseline:  ?Goal status: INITIAL ?  ?2.  Pt will demo resolution of R posterior canal BPPV in order to decr dizziness/unsteadiness for improved functional mobility.  ?Baseline:  ?Goal status: INITIAL ?  ?3.  Pt will improve condition 4 of mCTSIB to at least 12 seconds in order to demo improved vestibular input for balance.  ?Baseline: conditions 1-3: 30 seconds, condition 4: 5 seconds ?Goal status: REVISED ?  ?4.  Pt will undergo further testing of FGA with goal written if appropriate. ?Baseline:  ?Goal status: INITIAL ?  ?5.  Pt will improve DPS to at least a 58 in order to demo improved functional outcomes related to dizziness.  ?Baseline: 50 ?Goal status: INITIAL ?  ?  ?  ?ASSESSMENT: ?  ?CLINICAL IMPRESSION: ?Pt continued with + R sidelying test with R upbeating rotary nystagmus, indicating continued R posterior canalithiasis. Pt with incr latency period before nystagmus begins. Treated again with Semont maneuver x3 reps with using vibration on mastoid in each position for 45 seconds-1 minute each. Pt with no episodes of emesis today afterwards. Pt reporting feeling mild dizziness afterwards that subsided quickly, but overall feeling better. Pt demonstrating less intensity of nystagmus in R sidelying position and pt not reporting as much dizziness in this position. Will continue per POC.  ? ?Will continue per POC.  ?  ?OBJECTIVE IMPAIRMENTS Abnormal gait, decreased activity tolerance, decreased balance, difficulty walking, and dizziness.  ?  ?ACTIVITY  LIMITATIONS cleaning, community activity, driving, yard work, and play with her grandkids .  ?  ?PERSONAL FACTORS Age, Behavior pattern, and Past/current experiences are also affecting patient's functional outcome.  ?  ?  ?REHAB POTENTIAL: Fair due to chronicity of condition, pt declining tx for BPPV at eval (and hx of declining tx for vestibular deficits/BPPV)  ?  ?CLINICAL DECISION MAKING: Evolving/moderate complexity ?  ?EVALUATION COMPLEXITY: Moderate ?  ?  ?PLAN: ?PT FREQUENCY: 2x/week ?  ?PT DURATION: 8 weeks ?  ?PLANNED INTERVENTIONS: Therapeutic exercises, Therapeutic activity, Neuromuscular re-education, Balance training, Gait training, Patient/Family education, Vestibular training, and Canalith repositioning ?  ?PLAN FOR NEXT SESSION: Try Semont again for R posterior canal BPPV and using vibration on mastoid. Further assess FGA if able. Initial HEP for VOR x1.  ?  ? ? ?Arliss Journey, PT, DPT ?09/28/2021, 10:12 AM ? ? ?   ? ?

## 2021-09-28 NOTE — Progress Notes (Signed)
Patient's pre-visit was done today over the phone with the patient's spouse-patient is sleeping. Name,DOB and address verified. Patient denies any allergies to Eggs and Soy. Patient denies any problems with anesthesia/sedation. He denies any medical chart hx changes since last GI OV on 08/18/2021 except pt is having PT for vertigo.  Patient is not taking any diet pills or blood thinners. No home Oxygen. I ?ENDO Prep instructions sent to pt's MyChart -pt's spouse is aware and understands to call us back with any questions or concerns. Patient's spouse will make sure she follows prep instructions. Patient is aware of our care-partner policy.  ?The patient is COVID-19 vaccinated.   ?

## 2021-09-29 ENCOUNTER — Encounter: Payer: Self-pay | Admitting: Gastroenterology

## 2021-09-30 ENCOUNTER — Encounter: Payer: Self-pay | Admitting: Physical Therapy

## 2021-09-30 ENCOUNTER — Ambulatory Visit: Payer: Medicare PPO | Admitting: Physical Therapy

## 2021-09-30 DIAGNOSIS — R42 Dizziness and giddiness: Secondary | ICD-10-CM

## 2021-09-30 DIAGNOSIS — H8111 Benign paroxysmal vertigo, right ear: Secondary | ICD-10-CM | POA: Diagnosis not present

## 2021-09-30 DIAGNOSIS — R2681 Unsteadiness on feet: Secondary | ICD-10-CM

## 2021-09-30 NOTE — Therapy (Signed)
?OUTPATIENT PHYSICAL THERAPY VESTIBULAR TREATMENT NOTE ? ? ?Patient Name: [name] [name] ?MRN: [id number] ?DOB:10-Feb-1941, 81 y.o., female ?Today's Date: 09/30/2021 ? ?PCP: [name], MD ?REFERRING PROVIDER: Dow Adolph, DO ? ? PT End of Session - 09/30/21 1005   ? ? Visit Number 7   ? Number of Visits 9   ? Date for PT Re-Evaluation 11/08/21   ? Authorization Type [employer]   ? PT Start Time 1003   ? PT Stop Time 1048   full time not billable due to CRM  ? PT Time Calculation (min) 45 min   ? Activity Tolerance Patient tolerated treatment well   ? Behavior During Therapy [facility] for tasks assessed/performed   ? ?  ?  ? ?  ? ? ? ? ?Past Medical History:  ?Diagnosis Date  ? Arthritis   ? Dizziness   ? Esophageal stricture   ? Gastroesophageal reflux disease   ? Hiatal hernia   ? Hiatal hernia   ? Varicose veins   ? Vertigo   ? chronic with exacerbation  ? ?Past Surgical History:  ?Procedure Laterality Date  ? BLADDER SUSPENSION    ? x 2  ? ENDOVENOUS ABLATION SAPHENOUS VEIN W/ LASER Right 09-05-2013  ? right greater saphenous vein by Gretta Began MD  ? ENDOVENOUS ABLATION SAPHENOUS VEIN W/ LASER Left 10-18-2013  ? endovenous laser ablation left greater saphenous vein by Gretta Began MD  ? VAGINAL HYSTERECTOMY    ? ?Patient Active Problem List  ? Diagnosis Date Noted  ? Acute metabolic encephalopathy 08/13/2021  ? Memory loss 08/13/2021  ? UTI (urinary tract infection) 08/13/2021  ? Varicose veins of lower extremities with other complications 05/11/2013  ? Swelling of limb 05/11/2013  ? Pure hypercholesterolemia 06/07/2011  ? ALLERGIC RHINITIS 06/26/2007  ? GASTROESOPHAGEAL REFLUX DISEASE 06/26/2007  ? Diaphragmatic hernia 06/26/2007  ? VERTIGO 06/26/2007  ? DYSPHAGIA UNSPECIFIED 06/26/2007  ? ESOPHAGEAL STRICTURE 05/03/2007  ? ? ?ONSET DATE: 08/15/2021 (date of referral) ? ?REFERRING DIAG: R42 (ICD-10-CM) - Dizziness and giddiness  ? ?THERAPY DIAG:  ?BPPV (benign paroxysmal positional vertigo), right ? ?Unsteadiness  on feet ? ?Dizziness and giddiness ? ?PERTINENT HISTORY: medical history significant of GERD, memory issues.  Pt presented to ED on 08/13/21  with increased falls, change in behavior, and worsening confusion.  MRI brain negative for acute CVA.  Work-up revealed acute metabolic encephalopathy secondary to presumptive UTI.  ?  ?Per audiology note from 02/2021 from Dr. Zella Ball: ?Mrs. File exhibits a high-frequency vestibular ocular reflex deficit bilaterally as measured on vHIT testing. This bilateral deficit is of unclear etiology. She denies ever being treated with IV antibiotics or antineoplastic medications. 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  ? ?PRECAUTIONS: Other: Fall, Chronic dizziness ? ?SUBJECTIVE: Feels like dizziness is still the same, it has not gotten any worse.  ? ?PAIN: Are you having pain?  No ?  ? ? ? ?OBJECTIVE: ? ? ?POSITIONAL TESTS: ? ?Right Sidelying: upbeating, right nystagmus; Duration: ~5 seconds, with less of an intensity, pt with about a 5-8 second latency period before nystagmus starts, pt reporting dizziness is not as bad in this position.  ? ?.  ? ?VESTIBULAR TREATMENT: ? ?Canalith Repositioning: ?Semont Right Posterior: Number of Reps: 3, Response to Treatment: symptoms improved, and Comment: utilized vibration in each position on pt's mastoid for 1 minute. Pt reporting mild dizziness afterwards that subsided. quickly No  spinning sensation. But pt overall feeling better.  Pt's husband available to help with bringing pt's legs on and off the mat table.  ?   ? ? ?PATIENT EDUCATION: ?Education details: Continued to answer pt's questions on what causes BPPV and purpose of maneuvers to help with clearing BPPV.  ?Person educated: Patient and Spouse ?Education method: Explanation  ?Education comprehension: verbalized understanding  ?  ?GOALS: ?Goals reviewed with  patient? Yes ?  ?SHORT TERM GOALS: ALL STGS = LTGS ?  ?  ?  ?LONG TERM GOALS: Target date: 10/07/2021 ?  ?  ?Pt will be independent with initial HEP for vestibular/balance deficits.  ?Baseline:  ?Goal status: INITIAL ?  ?2.  Pt will demo resolution of R posterior canal BPPV in order to decr dizziness/unsteadiness for improved functional mobility.  ?Baseline:  ?Goal status: INITIAL ?  ?3.  Pt will improve condition 4 of mCTSIB to at least 12 seconds in order to demo improved vestibular input for balance.  ?Baseline: conditions 1-3: 30 seconds, condition 4: 5 seconds ?Goal status: REVISED ?  ?4.  Pt will undergo further testing of FGA with goal written if appropriate. ?Baseline:  ?Goal status: INITIAL ?  ?5.  Pt will improve DPS to at least a 58 in order to demo improved functional outcomes related to dizziness.  ?Baseline: 50 ?Goal status: INITIAL ?  ?  ?  ?ASSESSMENT: ?  ?CLINICAL IMPRESSION: ?Pt continued with + R sidelying test with R upbeating rotary nystagmus, indicating continued R posterior canalithiasis. Treated again with Semont maneuver x3 reps with using vibration on mastoid in each position for 45 seconds-1 minute each and therapist tried to incr speed of performing. Pt with no episodes of emesis today afterwards. Pt reporting feeling mild dizziness afterwards that subsided quickly, but overall feeling better. Pt continuing to demonstrate less intensity and duration of nystagmus in R sidelying position and only lasting for 5 seconds (previously would last for 8-10 seconds). and pt not reporting as much dizziness in this position. Will continue per POC.  ? ?  ?OBJECTIVE IMPAIRMENTS Abnormal gait, decreased activity tolerance, decreased balance, difficulty walking, and dizziness.  ?  ?ACTIVITY LIMITATIONS cleaning, community activity, driving, yard work, and play with her grandkids .  ?  ?PERSONAL FACTORS Age, Behavior pattern, and Past/current experiences are also affecting patient's functional outcome.  ?   ?  ?REHAB POTENTIAL: Fair due to chronicity of condition, pt declining tx for BPPV at eval (and hx of declining tx for vestibular deficits/BPPV)  ?  ?CLINICAL DECISION MAKING: Evolving/moderate complexity ?  ?EVALUATION COMPLEXITY: Moderate ?  ?  ?PLAN: ?PT FREQUENCY: 2x/week ?  ?PT DURATION: 8 weeks ?  ?PLANNED INTERVENTIONS: Therapeutic exercises, Therapeutic activity, Neuromuscular re-education, Balance training, Gait training, Patient/Family education, Vestibular training, and Canalith repositioning ?  ?PLAN FOR NEXT SESSION: Try Semont again for R posterior canal BPPV and using vibration on mastoid. Give initial HEP for VOR x1.  ?  ? ? ?Drake Leach, PT, DPT ?09/30/2021, 10:52 AM ? ? ?   ? ?

## 2021-10-05 ENCOUNTER — Ambulatory Visit: Payer: Medicare PPO | Admitting: Physical Therapy

## 2021-10-05 ENCOUNTER — Encounter: Payer: Self-pay | Admitting: Physical Therapy

## 2021-10-05 DIAGNOSIS — R2681 Unsteadiness on feet: Secondary | ICD-10-CM | POA: Diagnosis not present

## 2021-10-05 DIAGNOSIS — R42 Dizziness and giddiness: Secondary | ICD-10-CM | POA: Diagnosis not present

## 2021-10-05 DIAGNOSIS — H8111 Benign paroxysmal vertigo, right ear: Secondary | ICD-10-CM | POA: Diagnosis not present

## 2021-10-05 NOTE — Patient Instructions (Signed)
Gaze Stabilization: Sitting ? ? ? ?Keeping eyes on target on wall a couple feet away feet away, tilt head down 15-30? and move head side to side 10 times ? ?Repeat while moving head up and down 10 times. ? ?Performed 3 sets of 10 each.  ? ?Keep head moving continuously. Try to go slightly faster if you can.  ?Do __2__ sessions per day. ? ? ?Copyright ? VHI. All rights reserved.  ? ? ?Gaze Stabilization: Tip Card ? ?1.Target must remain in focus, not blurry, and appear stationary while head is in motion. ?2.Perform exercises with small head movements (45? to either side of midline). ?3.Increase speed of head motion so long as target is in focus. ?4.If you wear eyeglasses, be sure you can see target through lens (therapist will give specific instructions for bifocal / progressive lenses). ?5.These exercises may provoke dizziness or nausea. Work through these symptoms. If too dizzy, slow head movement slightly. Rest between each exercise. ?6.Exercises demand concentration; avoid distractions. ?7.For safety, perform standing exercises close to a counter, wall, corner, or next to someone. ? ?Copyright ? VHI. All rights reserved.  ? ? ?

## 2021-10-05 NOTE — Therapy (Signed)
?OUTPATIENT PHYSICAL THERAPY VESTIBULAR TREATMENT NOTE ? ? ?Patient Name: [name] [name] ?MRN: [id number] ?DOB:1940-12-31, 81 y.o., female ?Today's Date: 10/05/2021 ? ?PCP: [name], MD ?REFERRING PROVIDER: Dow Adolph, DO ? ? PT End of Session - 10/05/21 0849   ? ? Visit Number 8   ? Number of Visits 9   ? Date for PT Re-Evaluation 11/08/21   ? Authorization Type [employer]   ? PT Start Time 0848   ? PT Stop Time 0927   ? PT Time Calculation (min) 39 min   ? Activity Tolerance Patient tolerated treatment well   ? Behavior During Therapy [facility] for tasks assessed/performed   ? ?  ?  ? ?  ? ? ? ? ?Past Medical History:  ?Diagnosis Date  ? Arthritis   ? Dizziness   ? Esophageal stricture   ? Gastroesophageal reflux disease   ? Hiatal hernia   ? Hiatal hernia   ? Varicose veins   ? Vertigo   ? chronic with exacerbation  ? ?Past Surgical History:  ?Procedure Laterality Date  ? BLADDER SUSPENSION    ? x 2  ? ENDOVENOUS ABLATION SAPHENOUS VEIN W/ LASER Right 09-05-2013  ? right greater saphenous vein by Gretta Began MD  ? ENDOVENOUS ABLATION SAPHENOUS VEIN W/ LASER Left 10-18-2013  ? endovenous laser ablation left greater saphenous vein by Gretta Began MD  ? VAGINAL HYSTERECTOMY    ? ?Patient Active Problem List  ? Diagnosis Date Noted  ? Acute metabolic encephalopathy 08/13/2021  ? Memory loss 08/13/2021  ? UTI (urinary tract infection) 08/13/2021  ? Varicose veins of lower extremities with other complications 05/11/2013  ? Swelling of limb 05/11/2013  ? Pure hypercholesterolemia 06/07/2011  ? ALLERGIC RHINITIS 06/26/2007  ? GASTROESOPHAGEAL REFLUX DISEASE 06/26/2007  ? Diaphragmatic hernia 06/26/2007  ? VERTIGO 06/26/2007  ? DYSPHAGIA UNSPECIFIED 06/26/2007  ? ESOPHAGEAL STRICTURE 05/03/2007  ? ? ?ONSET DATE: 08/15/2021 (date of referral) ? ?REFERRING DIAG: R42 (ICD-10-CM) - Dizziness and giddiness  ? ?THERAPY DIAG:  ?BPPV (benign paroxysmal positional vertigo), right ? ?Unsteadiness on feet ? ?Dizziness and  giddiness ? ?PERTINENT HISTORY: medical history significant of GERD, memory issues.  Pt presented to ED on 08/13/21  with increased falls, change in behavior, and worsening confusion.  MRI brain negative for acute CVA.  Work-up revealed acute metabolic encephalopathy secondary to presumptive UTI.  ?  ?Per audiology note from 02/2021 from Dr. Zella Ball: ?Mrs. Witcher exhibits a high-frequency vestibular ocular reflex deficit bilaterally as measured on vHIT testing. This bilateral deficit is of unclear etiology. She denies ever being treated with IV antibiotics or antineoplastic medications. 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  ? ?PRECAUTIONS: Other: Fall, Chronic dizziness ? ?SUBJECTIVE:  Got dizzy in the car on the way here. Thinks she has noticed an improvement in the dizziness since she was last here.  ? ? ?PAIN: Are you having pain?  No ?  ? ? ? ?OBJECTIVE: ? ? ?POSITIONAL TESTS: ? ?Right Sidelying: upbeating, right nystagmus; Duration: ~5-8 seconds, with less of an intensity, pt with about a 10-15 second latency period before nystagmus starts, pt reporting dizziness is not as bad in this position.  ? ?.  ? ?VESTIBULAR TREATMENT: ? ?Canalith Repositioning: ?Epley Right: Number of Reps: 1, Response to Treatment: symptoms improved, and Comment: pt reporting feeling better afterwards  Pt's husband available to help with bringing pt's legs on and  off the mat table and help pt with rolling.  ? ?Trialed with 4" riser blocks under one side of the mat table to help with incr extension when performing Epley.  ? ? ?Gaze Adaptation: ?x1 Viewing Horizontal: Position: seated with back support, Reps: 10 (3 sets), and Comment: Cues for keeping eyes focused on the X the whole time, pt performing slowly, mild dizziness. Needs cues for continuous head motion.  ? ?and x1 Viewing Vertical:  Position: seated with  back support, Reps: 10 (3 sets), and Comment: Cues for keeping eyes focused on the X the whole time, pt performing slowly  ? ?PATIENT EDUCATION: ?Education details: Continued to answer pt's questions on what causes BPPV and purpose of maneuvers to help with clearing BPPV. Initial VOR x1 in seated exercise to HEP and purpose of performing.  ?Person educated: Patient and Spouse ?Education method: Explanation  ?Education comprehension: verbalized understanding  ?  ?GOALS: ?Goals reviewed with patient? Yes ?  ?SHORT TERM GOALS: ALL STGS = LTGS ?  ?  ?  ?LONG TERM GOALS: Target date: 10/07/2021 ?  ?  ?Pt will be independent with initial HEP for vestibular/balance deficits.  ?Baseline:  ?Goal status: INITIAL ?  ?2.  Pt will demo resolution of R posterior canal BPPV in order to decr dizziness/unsteadiness for improved functional mobility.  ?Baseline:  ?Goal status: INITIAL ?  ?3.  Pt will improve condition 4 of mCTSIB to at least 12 seconds in order to demo improved vestibular input for balance.  ?Baseline: conditions 1-3: 30 seconds, condition 4: 5 seconds ?Goal status: REVISED ?  ?4.  Pt will undergo further testing of FGA with goal written if appropriate. ?Baseline:  ?Goal status: INITIAL ?  ?5.  Pt will improve DPS to at least a 58 in order to demo improved functional outcomes related to dizziness.  ?Baseline: 50 ?Goal status: INITIAL ?  ?  ?  ?ASSESSMENT: ?  ?CLINICAL IMPRESSION: ?Pt continued with + R sidelying test with R upbeating rotary nystagmus, indicating continued R posterior canalithiasis. Pt's nystagmus only lasting ~8 seconds today with continued less intensity. Instead of performing Semont with vibration today, trialed using 4" steps under mat table to incr extension to perform Epley maneuver. Pt reporting feeling better afterwards with less dizziness. Pt did not wish to re-assess today. Instead, started pt on seated VOR x1 exercises for HEP. Pt tolerated session well, will continue to progress towards  LTGs.  ?  ?OBJECTIVE IMPAIRMENTS Abnormal gait, decreased activity tolerance, decreased balance, difficulty walking, and dizziness.  ?  ?ACTIVITY LIMITATIONS cleaning, community activity, driving, yard work, and play with her grandkids .  ?  ?PERSONAL FACTORS Age, Behavior pattern, and Past/current experiences are also affecting patient's functional outcome.  ?  ?  ?REHAB POTENTIAL: Fair due to chronicity of condition, pt declining tx for BPPV at eval (and hx of declining tx for vestibular deficits/BPPV)  ?  ?CLINICAL DECISION MAKING: Evolving/moderate complexity ?  ?EVALUATION COMPLEXITY: Moderate ?  ?  ?PLAN: ?PT FREQUENCY: 2x/week ?  ?PT DURATION: 8 weeks ?  ?PLANNED INTERVENTIONS: Therapeutic exercises, Therapeutic activity, Neuromuscular re-education, Balance training, Gait training, Patient/Family education, Vestibular training, and Canalith repositioning ?  ?PLAN FOR NEXT SESSION: Progress VOR x1, re-assess R posterior canal BPPV, treat with Epley again with 4" risers under mat table to help with incr extension.  ?  ? ? ?Drake Leach, PT, DPT ?10/05/2021, 9:29 AM ? ? ?   ? ?

## 2021-10-07 ENCOUNTER — Ambulatory Visit: Payer: Medicare PPO | Admitting: Physical Therapy

## 2021-10-12 ENCOUNTER — Ambulatory Visit (AMBULATORY_SURGERY_CENTER): Payer: Medicare PPO | Admitting: Gastroenterology

## 2021-10-12 ENCOUNTER — Encounter: Payer: Self-pay | Admitting: Gastroenterology

## 2021-10-12 VITALS — BP 100/53 | HR 59 | Temp 96.8°F | Resp 18 | Ht 63.0 in | Wt 143.0 lb

## 2021-10-12 DIAGNOSIS — K222 Esophageal obstruction: Secondary | ICD-10-CM | POA: Diagnosis not present

## 2021-10-12 DIAGNOSIS — K449 Diaphragmatic hernia without obstruction or gangrene: Secondary | ICD-10-CM

## 2021-10-12 DIAGNOSIS — R1319 Other dysphagia: Secondary | ICD-10-CM

## 2021-10-12 MED ORDER — SODIUM CHLORIDE 0.9 % IV SOLN: 500.0000 mL | Freq: Once | INTRAVENOUS | Status: DC

## 2021-10-12 NOTE — Progress Notes (Signed)
To pacu, VSS. Report to [email or web address] 

## 2021-10-12 NOTE — Progress Notes (Signed)
Pt's states no medical or surgical changes since previsit or office visit. 

## 2021-10-12 NOTE — Op Note (Signed)
[facility] ?Patient Name: [name] [name] ?Procedure Date: 10/12/2021 10:09 AM ?MRN: [id number] ?Endoscopist: Meryl Dare , MD ?Age: 81 ?Referring MD:  ?Date of Birth: [birth year] ?Gender: Female ?Account #: [id number] ?Procedure:                Upper GI endoscopy ?Indications:              Dysphagia ?Medicines:                Monitored Anesthesia Care ?Procedure:                Pre-Anesthesia Assessment: ?                          - Prior to the procedure, a History and Physical  ?                          was performed, and patient medications and  ?                          allergies were reviewed. The patient's tolerance of  ?                          previous anesthesia was also reviewed. The risks  ?                          and benefits of the procedure and the sedation  ?                          options and risks were discussed with the patient.  ?                          All questions were answered, and informed consent  ?                          was obtained. Prior Anticoagulants: The patient has  ?                          taken no previous anticoagulant or antiplatelet  ?                          agents. ASA Grade Assessment: II - A patient with  ?                          mild systemic disease. After reviewing the risks  ?                          and benefits, the patient was deemed in  ?                          satisfactory condition to undergo the procedure. ?                          After obtaining informed consent, the endoscope was  ?  passed under direct vision. Throughout the  ?                          procedure, the patient's blood pressure, pulse, and  ?                          oxygen saturations were monitored continuously. The  ?                          GIF [id number] #[id number] was introduced through the  ?                          mouth, and advanced to the second part of duodenum.  ?                          The upper GI endoscopy was accomplished without   ?                          difficulty. The patient tolerated the procedure  ?                          well. ?Scope In: ?Scope Out: ?Findings:                 One benign-appearing, intrinsic moderate stenosis  ?                          was found at the gastroesophageal junction. This  ?                          stenosis measured 1.2 cm (inner diameter) x less  ?                          than one cm (in length). The stenosis was  ?                          traversed. A guidewire was placed and the scope was  ?                          withdrawn. Dilations were performed with Savary  ?                          dilators with mild resistance at 14 mm, 15 mm and  ?                          16 mm. No heme noted. ?                          The lumen of the proximal esophagus and mid  ?                          esophagus was mildly dilated. ?                          The exam  of the esophagus was otherwise normal. ?                          A medium-sized hiatal hernia was present. Hill  ?                          Grade IV. ?                          The exam of the stomach was otherwise normal. ?                          The duodenal bulb and second portion of the  ?                          duodenum were normal. ?Complications:            No immediate complications. ?Estimated Blood Loss:     Estimated blood loss: none. ?Impression:               - Benign-appearing esophageal stenosis. Dilated. ?                          - Dilation in the proximal esophagus and in the mid  ?                          esophagus. ?                          - Medium-sized hiatal hernia. ?                          - Normal duodenal bulb and second portion of the  ?                          duodenum. ?                          - No specimens collected. ?Recommendation:           - Patient has a contact number available for  ?                          emergencies. The signs and symptoms of potential  ?                          delayed  complications were discussed with the  ?                          patient. Return to normal activities tomorrow.  ?                          Written discharge instructions were provided to the  ?                          patient. ?                          -  Clear liquid diet for 2 hours, then advance as  ?                          tolerated to soft diet today. ?                          - Resume prior diet tomorrow. ?                          - Follow antireflux measures long term. ?                          - Continue present medications including  ?                          pantoprazole 40 mg po qd long term. ?Meryl Dare, MD ?10/12/2021 10:27:43 AM ?This report has been signed electronically. ?

## 2021-10-12 NOTE — Patient Instructions (Signed)
Handouts on esophageal stricture, hiatal hernia, and post-dilation diet given to patient. ?Continue pantoprazole medication long term ?Follow post dilation diet today - 2 hours clear liquids then soft foods the remainder of today and you can go back to normal diet tomorrow  ? ? ?YOU HAD AN ENDOSCOPIC PROCEDURE TODAY AT THE [facility]:   Refer to the procedure report that was given to you for any specific questions about what was found during the examination.  If the procedure report does not answer your questions, please call your gastroenterologist to clarify.  If you requested that your care partner not be given the details of your procedure findings, then the procedure report has been included in a sealed envelope for you to review at your convenience later. ? ?YOU SHOULD EXPECT: Some feelings of bloating in the abdomen. Passage of more gas than usual.  Walking can help get rid of the air that was put into your GI tract during the procedure and reduce the bloating. If you had a lower endoscopy (such as a colonoscopy or flexible sigmoidoscopy) you may notice spotting of blood in your stool or on the toilet paper. If you underwent a bowel prep for your procedure, you may not have a normal bowel movement for a few days. ? ?Please Note:  You might notice some irritation and congestion in your nose or some drainage.  This is from the oxygen used during your procedure.  There is no need for concern and it should clear up in a day or so. ? ?SYMPTOMS TO REPORT IMMEDIATELY: ? ? ?Following upper endoscopy (EGD) ? Vomiting of blood or coffee ground material ? New chest pain or pain under the shoulder blades ? Painful or persistently difficult swallowing ? New shortness of breath ? Fever of 100?F or higher ? Black, tarry-looking stools ? ?For urgent or emergent issues, a gastroenterologist can be reached at any hour by calling [phone]. ?Do not use MyChart messaging for urgent concerns.  ? ? ?DIET:  We do  recommend a small meal at first, but then you may proceed to your regular diet.  Drink plenty of fluids but you should avoid alcoholic beverages for 24 hours. ? ?ACTIVITY:  You should plan to take it easy for the rest of today and you should NOT DRIVE or use heavy machinery until tomorrow (because of the sedation medicines used during the test).   ? ?FOLLOW UP: ?Our staff will call the number listed on your records 48-72 hours following your procedure to check on you and address any questions or concerns that you may have regarding the information given to you following your procedure. If we do not reach you, we will leave a message.  We will attempt to reach you two times.  During this call, we will ask if you have developed any symptoms of COVID 19. If you develop any symptoms (ie: fever, flu-like symptoms, shortness of breath, cough etc.) before then, please call [phone].  If you test positive for Covid 19 in the 2 weeks post procedure, please call and report this information to [place].   ? ?If any biopsies were taken you will be contacted by phone or by letter within the next 1-3 weeks.  Please call us at [phone] if you have not heard about the biopsies in 3 weeks.  ? ? ?SIGNATURES/CONFIDENTIALITY: ?You and/or your care partner have signed paperwork which will be entered into your electronic medical record.  These signatures attest to the fact  that that the information above on your After Visit Summary has been reviewed and is understood.  Full responsibility of the confidentiality of this discharge information lies with you and/or your care-partner.  ?

## 2021-10-12 NOTE — Progress Notes (Signed)
ASSESSMENT AND PLAN:  ?Esophageal dysphagia with history of stricture and hiatal hernia ?11/24/2015 EGD benign stricture at GE junction dilated to 14, small Hiatal hernia ?08/08/2012 EGD benign stricture at GE junction dilated to 15, moderate hiatal hernia, Cameron erosions ? ?EGD to evaluate for structural abnormality, tumor, erosive/infectious esophagititis, and EOE.   ?If the EGD is negative can then proceed to barium swallow ?Protonix 40 mg daily ?Discussed food impaction and reasons to go to the ER ?I discussed risks of EGD with patient today, including risk of sedation, bleeding or perforation.  ?Patient provides understanding and gave verbal consent to proceed. ?  ?ESOPHAGEAL STRICTURE ?Diaphragmatic hernia without obstruction and without gangrene ?  ?Vertigo ?Unremarkable CTA 07/2021 ?Going to vertigo PT ?  ?Patient Care Team: ?[name], MD as PCP - General (Family Medicine) ?  ?HISTORY OF PRESENT ILLNESS: ?81 y.o. female referred by [name], MD, with a past medical history of hiatal hernia, esophageal stricture, GERD and others listed below presents for evaluation of dysphagia.  ?Known to Dr. Russella Dar.  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 Had UTI/dehydration, on ABX.  ?Going to vertigo PT.  ?  ?11/24/2015 EGD benign stricture at GE junction dilated to 14, small Hiatal hernia ?08/08/2012 EGD benign stricture at GE junction dilated to 15, moderate hiatal hernia, cameron erosions ?Labs 08/13/2021 without anemia, normal kidney, liver, normla INR.  ?She has never smoked.  ?  ?[name], her husband is with her.  ?Husband states it is happening more frequently in last 3-6 months. Has had to do heimlich maneuver in last month.  ?Worse with meats, chickens, bar.  ?No issues with fluids.  ?No weight loss.  ?She has minor GERD with certain foods, suppose to be on GERD but only took for one month and then stopped.  ?No AB pain, no melena.  ?  ?  ?Current  Medications:  ?  ?  ?  ?  ?Current Outpatient Medications (Analgesics):  ?  acetaminophen (TYLENOL) 500 MG tablet, Take 1,000 mg by mouth every 6 (six) hours as needed for moderate pain or headache. ?  ?  ?Current Outpatient Medications (Other):  ?  cefadroxil (DURICEF) 500 MG capsule, 2 capsules ?  OVER THE COUNTER MEDICATION, Take 3 capsules by mouth in the morning and at bedtime. Balance of nature ?  TRANSDERM-SCOP 1.5 MG, Place 1 patch onto the skin See admin instructions. 1 patch every 3 days as needed for dizziness ?  ?Medical History:  ?    ?Past Medical History:  ?Diagnosis Date  ? Arthritis    ? Dizziness    ? Esophageal stricture    ? Gastroesophageal reflux disease    ? Hiatal hernia    ? Hiatal hernia    ? Varicose veins    ? Vertigo    ?  chronic with exacerbation  ?  ?Allergies:  ?    ?Allergies  ?Allergen Reactions  ? Streptomycin    ?  ?  ?Surgical History:  ?She  has a past surgical history that includes Vaginal hysterectomy; Bladder suspension; Endovenous ablation saphenous vein w/ laser (Right, 09-05-2013); and Endovenous ablation saphenous vein w/ laser (Left, 10-18-2013). ?Family History:  ?Her family history includes Breast cancer in her sister; Colon cancer in an other family member; Diabetes in her father; Heart attack in her sister; Kidney failure in her father. ?Social History:  ? reports that she has never smoked. She has never used smokeless tobacco. She reports  that she does not drink alcohol and does not use drugs. ?  ?REVIEW OF SYSTEMS  : All other systems reviewed and negative except where noted in the History of Present Illness. ?  ?  ?PHYSICAL EXAM: ?BP 130/80   Pulse 70   Ht 5\' 3"  (1.6 m)   Wt 143 lb (64.9 kg)   SpO2 97%   BMI 25.33 kg/m?  ?General:   Pleasant, well developed female in no acute distress ?Head:  Normocephalic and atraumatic. ?Eyes: sclerae anicteric,conjunctive pink  ?Heart:  regular rate and rhythm ?Pulm: Clear anteriorly; no wheezing ?Abdomen:  Soft, Flat AB,  skin exam normal, Normal bowel sounds.  no  tenderness . Without guarding and Without rebound, without hepatomegaly. ?Extremities:  Without edema. ?Msk:  Symmetrical without gross deformities. Peripheral pulses intact.  ?Neurologic:  Alert and  oriented x4;  grossly normal neurologically. ?Skin:   Dry and intact without significant lesions or rashes. ?Psychiatric: Demonstrates good judgement and reason without abnormal affect or behaviors. ?  ?  ? , PA-C ?

## 2021-10-12 NOTE — Progress Notes (Signed)
Called to room to assist during endoscopic procedure.  Patient ID and intended procedure confirmed with present staff. Received instructions for my participation in the procedure from the performing physician.  

## 2021-10-13 ENCOUNTER — Ambulatory Visit: Payer: Medicare PPO | Admitting: Physical Therapy

## 2021-10-13 ENCOUNTER — Encounter: Payer: Self-pay | Admitting: Physical Therapy

## 2021-10-13 DIAGNOSIS — R2681 Unsteadiness on feet: Secondary | ICD-10-CM | POA: Diagnosis not present

## 2021-10-13 DIAGNOSIS — H8111 Benign paroxysmal vertigo, right ear: Secondary | ICD-10-CM | POA: Diagnosis not present

## 2021-10-13 DIAGNOSIS — R42 Dizziness and giddiness: Secondary | ICD-10-CM | POA: Diagnosis not present

## 2021-10-13 NOTE — Therapy (Addendum)
?OUTPATIENT PHYSICAL THERAPY VESTIBULAR TREATMENT NOTE/10th VISIT PN ? ? ?Patient Name: [name] [name] ?MRN: [id number] ?DOB:03-05-1941, 81 y.o., female ?Today's Date: 10/14/2021 ? ?PCP: Shirline Frees, MD ?REFERRING PROVIDER: Irene Pap, DO ? ?10th Visit Physical Therapy Progress Note ? ?Dates of Reporting Period: 09/09/21 to 10/13/21 ? ? ? PT End of Session - 10/13/21 1621   ? ? Visit Number 9   ? Number of Visits 17   ? Date for PT Re-Evaluation 11/08/21   ? Authorization Type [employer]   ? Progress Note Due on Visit 19   done on visit 9  ? PT Start Time 1620   ? PT Stop Time 1601   ? PT Time Calculation (min) 38 min   ? Activity Tolerance Patient tolerated treatment well   ? Behavior During Therapy [facility] for tasks assessed/performed   ? ?  ?  ? ?  ? ? ? ? ?Past Medical History:  ?Diagnosis Date  ? Arthritis   ? Dizziness   ? Esophageal stricture   ? Gastroesophageal reflux disease   ? Hiatal hernia   ? Hiatal hernia   ? Varicose veins   ? Vertigo   ? chronic with exacerbation  ? ?Past Surgical History:  ?Procedure Laterality Date  ? BLADDER SUSPENSION    ? x 2  ? ENDOVENOUS ABLATION SAPHENOUS VEIN W/ LASER Right 09-05-2013  ? right greater saphenous vein by Curt Jews MD  ? ENDOVENOUS ABLATION SAPHENOUS VEIN W/ LASER Left 10-18-2013  ? endovenous laser ablation left greater saphenous vein by Curt Jews MD  ? VAGINAL HYSTERECTOMY    ? ?Patient Active Problem List  ? Diagnosis Date Noted  ? Acute metabolic encephalopathy 09/32/3557  ? Memory loss 08/13/2021  ? UTI (urinary tract infection) 08/13/2021  ? Varicose veins of lower extremities with other complications 32/20/2542  ? Swelling of limb 05/11/2013  ? Pure hypercholesterolemia 06/07/2011  ? ALLERGIC RHINITIS 06/26/2007  ? GASTROESOPHAGEAL REFLUX DISEASE 06/26/2007  ? Diaphragmatic hernia 06/26/2007  ? VERTIGO 06/26/2007  ? DYSPHAGIA UNSPECIFIED 06/26/2007  ? ESOPHAGEAL STRICTURE 05/03/2007  ? ? ?ONSET DATE: 08/15/2021 (date of referral) ? ?REFERRING DIAG:  R42 (ICD-10-CM) - Dizziness and giddiness  ? ?THERAPY DIAG:  ?BPPV (benign paroxysmal positional vertigo), right ? ?Dizziness and giddiness ? ?Unsteadiness on feet ? ?PERTINENT HISTORY: medical history significant of GERD, memory issues.  Pt presented to ED on 08/13/21  with increased falls, change in behavior, and worsening confusion.  MRI brain negative for acute CVA.  Work-up revealed acute metabolic encephalopathy secondary to presumptive UTI.  ?  ?Per audiology note from 02/2021 from Dr. Rachell Cipro: ?Mrs. 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 This bilateral deficit is of unclear etiology. She denies ever being treated with IV antibiotics or antineoplastic medications. 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  ? ?PRECAUTIONS: Other: Fall, Chronic dizziness ? ?SUBJECTIVE:   Feels like she is doing better. Has only had time to do the X exercises once at home.  ? ? ?PAIN: Are you having pain?  No ?  ? ? ? ?OBJECTIVE: ? ? ?POSITIONAL TESTS: ? ?Right Sidelying: upbeating, right nystagmus; Duration: ~5 seconds, with less of an intensity, pt reporting mild dizziness.  ? ?.  ? ?VESTIBULAR TREATMENT: ? ? [facility] PT Assessment - 10/13/21 1623   ? ?  ? Functional Gait  Assessment  ? Gait assessed  Yes   ? Gait Level Surface Walks 20 ft in less than 7 sec but greater than 5.5 sec, uses assistive device, slower speed, mild gait deviations, or deviates 6-10 in outside of the 12 in walkway width.   6.87  ? Change in Gait Speed Able to smoothly change walking speed without loss of balance or gait deviation. Deviate no more than 6 in outside of the 12 in walkway width.   ? Gait with Horizontal Head Turns Performs head turns smoothly with slight change in gait velocity (eg, minor disruption to smooth gait path), deviates 6-10 in outside 12 in walkway  width, or uses an assistive device.   ? Gait with Vertical Head Turns Performs task with slight change in gait velocity (eg, minor disruption to smooth gait path), deviates 6 - 10 in outside 12 in walkway width or uses assistive device   ? Gait and Pivot Turn Pivot turns safely within 3 sec and stops quickly with no loss of balance.   ? Step Over Obstacle Is able to step over 2 stacked shoe boxes taped together (9 in total height) without changing gait speed. No evidence of imbalance.   ? Gait with Narrow Base of Support Ambulates 4-7 steps.   ? Gait with Eyes Closed Walks 20 ft, slow speed, abnormal gait pattern, evidence for imbalance, deviates 10-15 in outside 12 in walkway width. Requires more than 9 sec to ambulate 20 ft.   19.3 seconds  ? Ambulating Backwards Walks 20 ft, uses assistive device, slower speed, mild gait deviations, deviates 6-10 in outside 12 in walkway width.   ? Steps Alternating feet, must use rail.   ? Total Score 21   ? FGA comment: 21/30   ? ?  ?  ? ?  ? ? ?Canalith Repositioning: ?Epley Right: Number of Reps: 1, Response to Treatment: symptoms improved, and Comment: pt reporting feeling better afterwards  Pt's husband available to help with bringing pt's legs on and off the mat table and help pt with rolling.  ? ?Performed with 4" riser blocks under one side of the mat table to help with incr extension when performing Epley.  ? ? ? FOTO: 53.2%  ? PT had to read out instructions on FOTO from computer to pt.  ? ?PATIENT EDUCATION: ?Education details: Results of FGA balance test and FOTO score. Discussed areas to continue to work on in PT for balance/vestibular deficits.  ?Person educated: Patient and Spouse ?Education method: Explanation  ?Education comprehension: verbalized understanding  ?  ?GOALS: ?Goals reviewed with patient? Yes ?  ?SHORT TERM GOALS: ALL STGS = LTGS ?  ?  ?  ?LONG TERM GOALS: Target date: 10/07/2021 ?  ?  ?Pt will be independent with initial HEP for vestibular/balance  deficits.  ?Baseline: pt has only performed VOR x1 exercise at home once.  ?Goal status: ON-GOING ?  ?2.  Pt will demo resolution of R posterior canal BPPV in order to decr dizziness/unsteadiness for improved functional mobility.  ?Baseline: pt continues with R posterior canal BPPV  ?Goal status: NOT MET  ?  ?3.  Pt will improve condition 4 of mCTSIB to at least 12 seconds in order to demo improved vestibular input for balance.  ?Baseline: conditions 1-3: 30 seconds, condition 4: 5 seconds ?Goal status: ON-GOING ?  ?4.  Pt will undergo further testing of FGA with goal written if appropriate. ?Baseline: 21/30 on 10/13/21 ?Goal status: MET ? ?5.  Pt will improve DPS to at least a  58 in order to demo improved functional outcomes related to dizziness.  ?Baseline: 50; 53.2%  ?Goal status: ONGOING  ? ? ?UPDATED/ONGOING LTGS FOR 8 WEEK POC ?LONG TERM GOALS: Target date: 11/08/2021 ?  ? ?Pt will be independent with initial HEP for vestibular/balance deficits.  ?Baseline: pt has only performed VOR x1 exercise at home once.  ?Goal status: ON-GOING ?  ?2.  Pt will demo resolution of R posterior canal BPPV in order to decr dizziness/unsteadiness for improved functional mobility.  ?Baseline: pt continues with R posterior canal BPPV  ?Goal status: ON-GOING ?  ?3.  Pt will improve condition 4 of mCTSIB to at least 12 seconds in order to demo improved vestibular input for balance.  ?Baseline: conditions 1-3: 30 seconds, condition 4: 5 seconds ?Goal status: ON-GOING ?  ?4.  Pt will improve FGA to at least a 24/30 in order to demo decr fall risk.  ?Baseline: 21/30 on 10/13/21 ?Goal status: NEW ? ?5.  Pt will improve DPS to at least a 58 in order to demo improved functional outcomes related to dizziness.  ?Baseline: 50; 53.2%  ?Goal status: ONGOING  ?  ?  ?  ?ASSESSMENT: ?  ?CLINICAL IMPRESSION: ?10th visit PN:  performed the FGA today with pt scoring a 21/30, indicating a moderate fall risk. LTG updated as appropriate. Pt continues with  positive R sidelying test with upbeating rotary nystagmus, indicating continued R posterior canalithiasis, however nystagmus was only for ~5 seconds and less intensity than in the past. Treated again

## 2021-10-14 ENCOUNTER — Encounter: Payer: Self-pay | Admitting: Physical Therapy

## 2021-10-14 ENCOUNTER — Ambulatory Visit: Payer: Medicare PPO | Admitting: Physical Therapy

## 2021-10-14 ENCOUNTER — Telehealth: Payer: Self-pay | Admitting: *Deleted

## 2021-10-14 DIAGNOSIS — R2681 Unsteadiness on feet: Secondary | ICD-10-CM

## 2021-10-14 DIAGNOSIS — H8111 Benign paroxysmal vertigo, right ear: Secondary | ICD-10-CM | POA: Diagnosis not present

## 2021-10-14 DIAGNOSIS — R42 Dizziness and giddiness: Secondary | ICD-10-CM | POA: Diagnosis not present

## 2021-10-14 NOTE — Therapy (Addendum)
?OUTPATIENT PHYSICAL THERAPY VESTIBULAR TREATMENT NOTE ? ? ?Patient Name: [name] [name] ?MRN: [id number] ?DOB:Aug 29, 1940, 81 y.o., female ?Today's Date: 10/14/2021 ? ?PCP: Johny BlamerHarris, William, MD ?REFERRING PROVIDER: Dow Adolpharole, Hall, DO ? ? ? PT End of Session - 10/14/21 0936   ? ? Visit Number 10   ? Number of Visits 17   ? Date for PT Re-Evaluation 11/08/21   ? Authorization Type [employer]   ? Progress Note Due on Visit 19   done on visit 9  ? PT Start Time [phone]   ? PT Stop Time 1013   ? PT Time Calculation (min) 39 min   ? Activity Tolerance Patient tolerated treatment well   ? [facility] tasks assessed/performed   ? ?  ?  ? ?  ? ? ? ? ?Past Medical History:  ?Diagnosis Date  ? Arthritis   ? Dizziness   ? Esophageal stricture   ? Gastroesophageal reflux disease   ? Hiatal hernia   ? Hiatal hernia   ? Varicose veins   ? Vertigo   ? chronic with exacerbation  ? ?Past Surgical History:  ?Procedure Laterality Date  ? BLADDER SUSPENSION    ? x 2  ? ENDOVENOUS ABLATION SAPHENOUS VEIN W/ LASER Right 09-05-2013  ? right greater saphenous vein by Gretta Beganodd Early MD  ? ENDOVENOUS ABLATION SAPHENOUS VEIN W/ LASER Left 10-18-2013  ? endovenous laser ablation left greater saphenous vein by Gretta Beganodd Early MD  ? VAGINAL HYSTERECTOMY    ? ?Patient Active Problem List  ? Diagnosis Date Noted  ? Acute metabolic encephalopathy 08/13/2021  ? Memory loss 08/13/2021  ? UTI (urinary tract infection) 08/13/2021  ? Varicose veins of lower extremities with other complications 05/11/2013  ? Swelling of limb 05/11/2013  ? Pure hypercholesterolemia 06/07/2011  ? ALLERGIC RHINITIS 06/26/2007  ? GASTROESOPHAGEAL REFLUX DISEASE 06/26/2007  ? Diaphragmatic hernia 06/26/2007  ? VERTIGO 06/26/2007  ? DYSPHAGIA UNSPECIFIED 06/26/2007  ? ESOPHAGEAL STRICTURE 05/03/2007  ? ? ?ONSET DATE: 08/15/2021 (date of referral) ? ?REFERRING DIAG: R42 (ICD-10-CM) - Dizziness and giddiness  ? ?THERAPY DIAG:  ?BPPV (benign paroxysmal positional vertigo),  right ? ?Dizziness and giddiness ? ?Unsteadiness on feet ? ?PERTINENT HISTORY: medical history significant of GERD, memory issues.  Pt presented to ED on 08/13/21  with increased falls, change in behavior, and worsening confusion.  MRI brain negative for acute CVA.  Work-up revealed acute metabolic encephalopathy secondary to presumptive UTI.  ?  ?Per audiology note from 02/2021 from Dr. Zella BallWorkman: ?Mrs. Lunt exhibits a high-frequency vestibular ocular reflex deficit bilaterally as measured on vHIT testing. This bilateral deficit is of unclear etiology. She denies ever being treated with IV antibiotics or antineoplastic medications. 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  ? ?PRECAUTIONS: Other: Fall, Chronic dizziness ? ?SUBJECTIVE:   No changes since yesterday.  ? ? ?PAIN: Are you having pain?  No ?  ? ? ? ?OBJECTIVE: ? ? ?POSITIONAL TESTS: ? Vestibular Assessment - 10/14/21 1002   ? ?  ? Positional Testing  ? Sidelying Test Sidelying Left;Sidelying Right   ? Horizontal Canal Testing Horizontal Canal Left;Horizontal Canal Right   ?  ? Sidelying Right  ? Sidelying Right Duration No dizziness   ? Sidelying Right Symptoms No nystagmus   ?  ? Sidelying Left  ? Sidelying Left Symptoms No nystagmus   ?  ? Horizontal Canal Right  ?  Horizontal Canal Right Symptoms Normal   ?  ? Horizontal Canal Left  ? Horizontal Canal Left Symptoms Normal   ? ?  ?  ? ?  ? ? ?.  ? ?VESTIBULAR TREATMENT: ? ?  ?  ?Gaze Adaptation: ?x1 Viewing Horizontal: Position: seated Reps: 2 x 30 seconds, x 60 seconds and Comment: cues for slightly incr speed. Pt able to tolerate well.  ?  ?and x1 Viewing Vertical:  Position: seated Reps: 2 x 30 seconds, x 60 seconds and Comment: cues for slightly incr speed. Pt able to tolerate well. No reports of dizziness  ? ?Discussed progressing to 60 seconds at home.  ? ? ?Access Code:  [email or web address] ?URL: [email or web address] ?Date: 10/14/2021 ?Prepared by: Sherlie Ban ? ?Additions to HEP for balance  ? ?Exercises ?- Standing Balance with Eyes Closed on Foam  - 1 x daily - 5 x weekly - 3 sets - 30 hold - performed with feet apart, mild postural sway.  ?- Standing on Foam Pad  - 1 x daily - 5 x weekly - 2 sets - 10 reps - with feet apart, 2 sets of 10 reps with head turns, 2 sets of 10 reps with head nods. No dizziness. Performed with slight space between feet. Cues for slowing down speed of head movements.  ? ? ?PATIENT EDUCATION: ?Education details: Resolution of R posterior canal BPPV, progressing VOR x1 to 60 seconds in sitting, standing balance additions to HEP.  ?Person educated: Patient and Spouse ?Education method: Explanation  ?Education comprehension: verbalized understanding  ?  ?GOALS: ?Goals reviewed with patient? Yes ?  ?SHORT TERM GOALS: ALL STGS = LTGS ?  ?  ?UPDATED/ONGOING LTGS FOR 8 WEEK POC ? ?LONG TERM GOALS: Target date: 11/08/2021 ?  ? ?Pt will be independent with initial HEP for vestibular/balance deficits.  ?Baseline: pt has only performed VOR x1 exercise at home once.  ?Goal status: ON-GOING ?  ?2.  Pt will demo resolution of R posterior canal BPPV in order to decr dizziness/unsteadiness for improved functional mobility.  ?Baseline: pt continues with R posterior canal BPPV  ?Goal status: ON-GOING ?  ?3.  Pt will improve condition 4 of mCTSIB to at least 12 seconds in order to demo improved vestibular input for balance.  ?Baseline: conditions 1-3: 30 seconds, condition 4: 5 seconds ?Goal status: ON-GOING ?  ?4.  Pt will improve FGA to at least a 24/30 in order to demo decr fall risk.  ?Baseline: 21/30 on 10/13/21 ?Goal status: NEW ? ?5.  Pt will improve DPS to at least a 58 in order to demo improved functional outcomes related to dizziness.  ?Baseline: 50; 53.2%  ?Goal status: ONGOING  ?  ? ?ASSESSMENT: ?  ?CLINICAL IMPRESSION: ?Re-assessed positional testing  today with pt negative for BPPV in all testing positions, indicating resolution of R posterior canalithiasis (!) Will continue to keep an eye on it as needed. Remainder of session focused on progressing time of VOR in sitting and adding in balance exercises to HEP for head motions and eyes closed. Pt tolerated session well, will continue to progress towards LTGs.  ?  ?OBJECTIVE IMPAIRMENTS Abnormal gait, decreased activity tolerance, decreased balance, difficulty walking, and dizziness.  ?  ?ACTIVITY LIMITATIONS cleaning, community activity, driving, yard work, and play with her grandkids .  ?  ?PERSONAL FACTORS Age, Behavior pattern, and Past/current experiences are also affecting patient's functional outcome.  ?  ?  ?REHAB POTENTIAL: Fair due to chronicity of condition, pt declining tx for  BPPV at eval (and hx of declining tx for vestibular deficits/BPPV)  ?  ?CLINICAL DECISION MAKING: Evolving/moderate complexity ?  ?EVALUATION COMPLEXITY: Moderate ?  ?  ?PLAN: ?PT FREQUENCY: 2x/week ?  ?PT DURATION: 8 weeks ?  ?PLANNED INTERVENTIONS: Therapeutic exercises, Therapeutic activity, Neuromuscular re-education, Balance training, Gait training, Patient/Family education, Vestibular training, and Canalith repositioning ?  ?PLAN FOR NEXT SESSION: Progress VOR x1, re-assess R posterior canal BPPV as needed, balance with eyes closed, head motions, unlevel surfaces, tandem.  ?  ? ? ?Drake Leach, PT, DPT ?10/14/2021, 12:25 PM ? ? ?   ? ?

## 2021-10-14 NOTE — Telephone Encounter (Signed)
Second follow up call attempted.  Patient asked if I could call the home phone and leave a message regarding the purpose of this call.  No other phone number was on file ?

## 2021-10-19 ENCOUNTER — Ambulatory Visit: Payer: Medicare PPO | Admitting: Physical Therapy

## 2021-10-19 ENCOUNTER — Encounter: Payer: Self-pay | Admitting: Physical Therapy

## 2021-10-19 DIAGNOSIS — R42 Dizziness and giddiness: Secondary | ICD-10-CM | POA: Diagnosis not present

## 2021-10-19 DIAGNOSIS — H8111 Benign paroxysmal vertigo, right ear: Secondary | ICD-10-CM | POA: Diagnosis not present

## 2021-10-19 DIAGNOSIS — R2681 Unsteadiness on feet: Secondary | ICD-10-CM | POA: Diagnosis not present

## 2021-10-19 NOTE — Therapy (Addendum)
OUTPATIENT PHYSICAL THERAPY VESTIBULAR TREATMENT NOTE   Patient Name: [name] [name] MRN: [id number] DOB:1940-12-14, 81 y.o., female Today's Date: 10/19/2021  PCP: [name], MD REFERRING PROVIDER: Dow Adolph, DO    PT End of Session - 10/19/21 1453     Visit Number 11    Number of Visits 17    Date for PT Re-Evaluation 11/08/21    Authorization Type [employer]- 8 visits approved 10/13/21- 11/08/21    Progress Note Due on Visit 19   done on visit 9   PT Start Time 1448    PT Stop Time 1532    PT Time Calculation (min) 44 min    Activity Tolerance Patient tolerated treatment well    Behavior During Therapy WFL for tasks assessed/performed               Past Medical History:  Diagnosis Date   Arthritis    Dizziness    Esophageal stricture    Gastroesophageal reflux disease    Hiatal hernia    Hiatal hernia    Varicose veins    Vertigo    chronic with exacerbation   Past Surgical History:  Procedure Laterality Date   BLADDER SUSPENSION     x 2   ENDOVENOUS ABLATION SAPHENOUS VEIN W/ LASER Right 09-05-2013   right greater saphenous vein by Gretta Began MD   ENDOVENOUS ABLATION SAPHENOUS VEIN W/ LASER Left 10-18-2013   endovenous laser ablation left greater saphenous vein by Gretta Began MD   VAGINAL HYSTERECTOMY     Patient Active Problem List   Diagnosis Date Noted   Acute metabolic encephalopathy 08/13/2021   Memory loss 08/13/2021   UTI (urinary tract infection) 08/13/2021   Varicose veins of lower extremities with other complications 05/11/2013   Swelling of limb 05/11/2013   Pure hypercholesterolemia 06/07/2011   ALLERGIC RHINITIS 06/26/2007   GASTROESOPHAGEAL REFLUX DISEASE 06/26/2007   Diaphragmatic hernia 06/26/2007   VERTIGO 06/26/2007   DYSPHAGIA UNSPECIFIED 06/26/2007   ESOPHAGEAL STRICTURE 05/03/2007    ONSET DATE: 08/15/2021 (date of referral)  REFERRING DIAG: R42 (ICD-10-CM) - Dizziness and giddiness   THERAPY DIAG:  Dizziness  and giddiness  Unsteadiness on feet  BPPV (benign paroxysmal positional vertigo), right  PERTINENT HISTORY: medical history significant of GERD, memory issues.  Pt presented to ED on 08/13/21  with increased falls, change in behavior, and worsening confusion.  MRI brain negative for acute CVA.  Work-up revealed acute metabolic encephalopathy secondary to presumptive UTI.    Per audiology note from 02/2021 from Dr. Zella Ball: Mrs. Costanza exhibits a high-frequency vestibular ocular reflex deficit bilaterally as measured on vHIT testing. This bilateral deficit is of unclear etiology. She denies ever being treated with IV antibiotics or antineoplastic medications. 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   PRECAUTIONS: Other: Fall, Chronic dizziness  SUBJECTIVE:   Pt's husband wishing for therapist to educate pt on why she needs to continue with therapy based on vestibular deficits. No more spinning, just feeling more off balance.    PAIN: Are you having pain?  No      OBJECTIVE:   POSITIONAL TESTS: Right Sidelying: upbeating, right nystagmus; Duration: ~5 seconds, very low intensity, very little dizziness.   When re-assessed, pt with no nystagmus and no dizziness after 1 rep of Epley.      .   VESTIBULAR TREATMENT:  Canalith Repositioning: Epley Right: Number of Reps: 1,  Response to Treatment: symptoms improved, and Comment: pt reporting feeling better afterwards  Pt's husband available to help with bringing pt's legs on and off the mat table and help pt with rolling.    Performed with 4" riser blocks under one side of the mat table to help with incr extension when performing Epley.   Standing Balance: Surface: Airex Position: Feet Hip Width Apart Wide Base of Support Completed with: Eyes Closed with feet hip width 3 x 30 seconds, mild postural sway. Then  wide BOS 2 sets of 5 reps slow head turns, 2 sets of 5 reps slow head nods. Cues throughout for which direction that pt needed to move her head.       Access Code: [email or web address] URL: [email or web address] Date: 10/19/2021 Prepared by: Sherlie Banhloe Sakoya Win  Exercises - Standing Balance with Eyes Closed on Foam  - 1 x daily - 5 x weekly - 3 sets - 30 hold - Standing on Foam Pad  - 1 x daily - 5 x weekly - 2 sets - 10 reps  New addition on 10/19/21 - Tandem Walking with Counter Support  - 1 x daily - 5 x weekly - 3 sets   PATIENT EDUCATION: Education details: Educated on purpose of PT to work on vestibular deficits and how these can affect pt's balance/make her feel dizzy. Pt continuing to ask on what causes BPPV and PT educating pt on BPPV throughout session. Tandem gait addition at countertop to HEP.  Person educated: Patient and Spouse Education method: Explanation  Education comprehension: verbalized understanding    GOALS: Goals reviewed with patient? Yes   SHORT TERM GOALS: ALL STGS = LTGS     UPDATED/ONGOING LTGS FOR 8 WEEK POC  LONG TERM GOALS: Target date: 11/08/2021    Pt will be independent with initial HEP for vestibular/balance deficits.  Baseline: pt has only performed VOR x1 exercise at home once.  Goal status: ON-GOING   2.  Pt will demo resolution of R posterior canal BPPV in order to decr dizziness/unsteadiness for improved functional mobility.  Baseline: pt continues with R posterior canal BPPV  Goal status: ON-GOING   3.  Pt will improve condition 4 of mCTSIB to at least 12 seconds in order to demo improved vestibular input for balance.  Baseline: conditions 1-3: 30 seconds, condition 4: 5 seconds Goal status: ON-GOING   4.  Pt will improve FGA to at least a 24/30 in order to demo decr fall risk.  Baseline: 21/30 on 10/13/21 Goal status: NEW  5.  Pt will improve DPS to at least a 58 in order to demo improved functional outcomes related to  dizziness.  Baseline: 50; 53.2%  Goal status: ONGOING     ASSESSMENT:   CLINICAL IMPRESSION: Re-assessed R sidelying test today with pt positive for R Posterior Canalithiasis due to R upbeating nystagmus (only lasting 5 seconds and of very low intensity). Treated with Epley maneuver x1 rep with pt demonstrating a resolution of BPPV after re-assessment of R sidelying test with no dizziness/nystagmus. Pt also reporting feeling better afterwards. Remainder of session focused on standing balance for narrow BOS and incr vestibular input. Pt tolerated session well, will continue to progress towards LTGs.    OBJECTIVE IMPAIRMENTS Abnormal gait, decreased activity tolerance, decreased balance, difficulty walking, and dizziness.    ACTIVITY LIMITATIONS cleaning, community activity, driving, yard work, and play with her grandkids .    PERSONAL FACTORS Age, Behavior pattern, and Past/current experiences are also affecting patient's functional outcome.  REHAB POTENTIAL: Fair due to chronicity of condition, pt declining tx for BPPV at eval (and hx of declining tx for vestibular deficits/BPPV)    CLINICAL DECISION MAKING: Evolving/moderate complexity   EVALUATION COMPLEXITY: Moderate     PLAN: PT FREQUENCY: 2x/week   PT DURATION: 8 weeks   PLANNED INTERVENTIONS: Therapeutic exercises, Therapeutic activity, Neuromuscular re-education, Balance training, Gait training, Patient/Family education, Vestibular training, and Canalith repositioning   PLAN FOR NEXT SESSION: Progress VOR x1, re-assess R posterior canal BPPV as needed, balance with eyes closed, head motions, unlevel surfaces, tandem.      Drake Leach, PT, DPT 10/19/2021, 4:31 PM

## 2021-10-19 NOTE — Patient Instructions (Signed)
Gaze Stabilization: Sitting    Keeping eyes on target on wall a couple feet away, tilt head down 15-30 and move head side to side for _60___ seconds. Repeat while moving head up and down for __60__ seconds.  Perform each 2-3 times Do ___2_ sessions per day.    Gaze Stabilization: Tip Card  1.Target must remain in focus, not blurry, and appear stationary while head is in motion. 2.Perform exercises with small head movements (45 to either side of midline). 3.Increase speed of head motion so long as target is in focus. 4.If you wear eyeglasses, be sure you can see target through lens (therapist will give specific instructions for bifocal / progressive lenses). 5.These exercises may provoke dizziness or nausea. Work through these symptoms. If too dizzy, slow head movement slightly. Rest between each exercise. 6.Exercises demand concentration; avoid distractions. 7.For safety, perform standing exercises close to a counter, wall, corner, or next to someone.  Copyright  VHI. All rights reserved.    Copyright  VHI. All rights reserved.

## 2021-10-21 ENCOUNTER — Ambulatory Visit: Payer: Medicare PPO | Admitting: Physical Therapy

## 2021-10-21 ENCOUNTER — Encounter: Payer: Self-pay | Admitting: Physical Therapy

## 2021-10-21 DIAGNOSIS — R2681 Unsteadiness on feet: Secondary | ICD-10-CM | POA: Diagnosis not present

## 2021-10-21 DIAGNOSIS — R42 Dizziness and giddiness: Secondary | ICD-10-CM

## 2021-10-21 DIAGNOSIS — H8111 Benign paroxysmal vertigo, right ear: Secondary | ICD-10-CM | POA: Diagnosis not present

## 2021-10-21 NOTE — Therapy (Signed)
OUTPATIENT PHYSICAL THERAPY VESTIBULAR TREATMENT NOTE   Patient Name: [name] [name] MRN: [id number] DOB:02/28/1941, 81 y.o., female Today's Date: 10/21/2021  PCP: [name], MD REFERRING PROVIDER: Dow Adolph, DO    PT End of Session - 10/21/21 1314     Visit Number 12    Number of Visits 17    Date for PT Re-Evaluation 11/08/21    Authorization Type [employer]- 8 visits approved 10/13/21- 11/08/21    Progress Note Due on Visit 19   done on visit 9   PT Start Time 1313    PT Stop Time 1356    PT Time Calculation (min) 43 min    Activity Tolerance Patient tolerated treatment well    Behavior During Therapy WFL for tasks assessed/performed               Past Medical History:  Diagnosis Date   Arthritis    Dizziness    Esophageal stricture    Gastroesophageal reflux disease    Hiatal hernia    Hiatal hernia    Varicose veins    Vertigo    chronic with exacerbation   Past Surgical History:  Procedure Laterality Date   BLADDER SUSPENSION     x 2   ENDOVENOUS ABLATION SAPHENOUS VEIN W/ LASER Right 09-05-2013   right greater saphenous vein by Gretta Began MD   ENDOVENOUS ABLATION SAPHENOUS VEIN W/ LASER Left 10-18-2013   endovenous laser ablation left greater saphenous vein by Gretta Began MD   VAGINAL HYSTERECTOMY     Patient Active Problem List   Diagnosis Date Noted   Acute metabolic encephalopathy 08/13/2021   Memory loss 08/13/2021   UTI (urinary tract infection) 08/13/2021   Varicose veins of lower extremities with other complications 05/11/2013   Swelling of limb 05/11/2013   Pure hypercholesterolemia 06/07/2011   ALLERGIC RHINITIS 06/26/2007   GASTROESOPHAGEAL REFLUX DISEASE 06/26/2007   Diaphragmatic hernia 06/26/2007   VERTIGO 06/26/2007   DYSPHAGIA UNSPECIFIED 06/26/2007   ESOPHAGEAL STRICTURE 05/03/2007    ONSET DATE: 08/15/2021 (date of referral)  REFERRING DIAG: R42 (ICD-10-CM) - Dizziness and giddiness   THERAPY DIAG:  Dizziness  and giddiness  Unsteadiness on feet  PERTINENT HISTORY: medical history significant of GERD, memory issues.  Pt presented to ED on 08/13/21  with increased falls, change in behavior, and worsening confusion.  MRI brain negative for acute CVA.  Work-up revealed acute metabolic encephalopathy secondary to presumptive UTI.    Per audiology note from 02/2021 from Dr. Zella Ball: Mrs. Nery exhibits a high-frequency vestibular ocular reflex deficit bilaterally as measured on vHIT testing. This bilateral deficit is of unclear etiology. She denies ever being treated with IV antibiotics or antineoplastic medications. 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   PRECAUTIONS: Other: Fall, Chronic dizziness  SUBJECTIVE:  Reports feeling dizzy after being in the car. Has a hx of motion sensitivity.   PAIN: Are you having pain?  No      OBJECTIVE:   POSITIONAL TESTS: Right Sidelying: upbeating, right nystagmus; Duration: 0 seconds. No dizziness.      .   VESTIBULAR TREATMENT:    Gaze Adaptation: x1 Viewing Horizontal: Position: seated Reps: 2 x 30 seconds, x60 seconds. Comment: cues for incr speed. Pt able to tolerate well. Mild dizziness.    and x1 Viewing Vertical:  Position: seated Reps: x 30 seconds, x 60 seconds and Comment: cues for slightly incr  speed. Mild dizziness.    Discussed importance of performing at home.   Habituation with sidelying <> sit, very mild sx when coming back up to sitting. Performed x3 reps to both sides. Therapist helping bring pt's legs on and off the bed. Pt reports feeling better after and reports less duration of sx.   Rockerboard: In A/P direction: x15 reps weight shifting w/ intermittent UE support, cues for fully weight shifting. Keeping board steady x5 reps head turns, x5 reps head nods, then repeated performing with 10 reps each.  Intermittent taps to bars for balance  On air ex; feet hip width EC 2 x 30 seconds, more narrow BOS x30 seconds, with feet together pt only able to hold for 15 seconds. Needing initial taps for balance.     Gait x230' with pt performing head motions on command, pt with incr difficulty looking up. Had pt look towards R and L and therapist holding up fingers and pt having to name how many fingers therapist was holding up. Pt gets easily distracted and needs cues to pay attention to the task at hand.      PATIENT EDUCATION: Education details: Importance of performing HEP consistently at home for vestibular deficits and purpose of each exercise.   Person educated: Patient and Spouse Education method: Explanation  Education comprehension: verbalized understanding   HEP:   [email or web address] AD  See pt instructions for VOR x1.   GOALS: Goals reviewed with patient? Yes   SHORT TERM GOALS: ALL STGS = LTGS     UPDATED/ONGOING LTGS FOR 8 WEEK POC  LONG TERM GOALS: Target date: 11/08/2021    Pt will be independent with initial HEP for vestibular/balance deficits.  Baseline: pt has only performed VOR x1 exercise at home once.  Goal status: ON-GOING   2.  Pt will demo resolution of R posterior canal BPPV in order to decr dizziness/unsteadiness for improved functional mobility.  Baseline: pt continues with R posterior canal BPPV  Goal status: ON-GOING   3.  Pt will improve condition 4 of mCTSIB to at least 12 seconds in order to demo improved vestibular input for balance.  Baseline: conditions 1-3: 30 seconds, condition 4: 5 seconds Goal status: ON-GOING   4.  Pt will improve FGA to at least a 24/30 in order to demo decr fall risk.  Baseline: 21/30 on 10/13/21 Goal status: NEW  5.  Pt will improve DPS to at least a 58 in order to demo improved functional outcomes related to dizziness.  Baseline: 50; 53.2%  Goal status: ONGOING     ASSESSMENT:   CLINICAL IMPRESSION: Pt demonstrated  resolution of R posterior canalithiasis in R sidelying test today. Remainder of session focused on habituation tasks for sit <> sidelying, balance with head motions and eyes closed. With incr reps of sidelying <> sit pt reporting less feelings of dizziness and sx did not last as long (pt started with mild sx). Pt tolerated session well, will continue to progress towards LTGs.    OBJECTIVE IMPAIRMENTS Abnormal gait, decreased activity tolerance, decreased balance, difficulty walking, and dizziness.    ACTIVITY LIMITATIONS cleaning, community activity, driving, yard work, and play with her grandkids .    PERSONAL FACTORS Age, Behavior pattern, and Past/current experiences are also affecting patient's functional outcome.      REHAB POTENTIAL: Fair due to chronicity of condition, pt declining tx for BPPV at eval (and hx of declining tx for vestibular deficits/BPPV)    CLINICAL DECISION MAKING: Evolving/moderate complexity   EVALUATION  COMPLEXITY: Moderate     PLAN: PT FREQUENCY: 2x/week   PT DURATION: 8 weeks   PLANNED INTERVENTIONS: Therapeutic exercises, Therapeutic activity, Neuromuscular re-education, Balance training, Gait training, Patient/Family education, Vestibular training, and Canalith repositioning   PLAN FOR NEXT SESSION: Progress VOR x1, re-assess R posterior canal BPPV as needed, balance with eyes closed, head motions, unlevel surfaces, tandem.      Drake Leachhloe N Fitz Matsuo, PT, DPT 10/21/2021, 2:02 PM

## 2021-10-27 ENCOUNTER — Ambulatory Visit: Payer: Medicare PPO | Admitting: Physical Therapy

## 2021-10-27 ENCOUNTER — Encounter: Payer: Self-pay | Admitting: Physical Therapy

## 2021-10-27 DIAGNOSIS — R2681 Unsteadiness on feet: Secondary | ICD-10-CM

## 2021-10-27 DIAGNOSIS — H8111 Benign paroxysmal vertigo, right ear: Secondary | ICD-10-CM | POA: Diagnosis not present

## 2021-10-27 DIAGNOSIS — R42 Dizziness and giddiness: Secondary | ICD-10-CM

## 2021-10-27 NOTE — Therapy (Signed)
OUTPATIENT PHYSICAL THERAPY VESTIBULAR TREATMENT NOTE   Patient Name: [name] [name] MRN: [id number] DOB:02/27/1941, 81 y.o., female Today's Date: 10/27/2021  PCP: [name], MD REFERRING PROVIDER: Dow Adolph, DO    PT End of Session - 10/27/21 1105     Visit Number 13    Number of Visits 17    Date for PT Re-Evaluation 11/08/21    Authorization Type [employer]- 8 visits approved 10/13/21- 11/08/21    Progress Note Due on Visit 19   done on visit 9   PT Start Time 1103    PT Stop Time 1143    PT Time Calculation (min) 40 min    Activity Tolerance Patient tolerated treatment well    Behavior During Therapy WFL for tasks assessed/performed;Agitated               Past Medical History:  Diagnosis Date   Arthritis    Dizziness    Esophageal stricture    Gastroesophageal reflux disease    Hiatal hernia    Hiatal hernia    Varicose veins    Vertigo    chronic with exacerbation   Past Surgical History:  Procedure Laterality Date   BLADDER SUSPENSION     x 2   ENDOVENOUS ABLATION SAPHENOUS VEIN W/ LASER Right 09-05-2013   right greater saphenous vein by Gretta Began MD   ENDOVENOUS ABLATION SAPHENOUS VEIN W/ LASER Left 10-18-2013   endovenous laser ablation left greater saphenous vein by Gretta Began MD   VAGINAL HYSTERECTOMY     Patient Active Problem List   Diagnosis Date Noted   Acute metabolic encephalopathy 08/13/2021   Memory loss 08/13/2021   UTI (urinary tract infection) 08/13/2021   Varicose veins of lower extremities with other complications 05/11/2013   Swelling of limb 05/11/2013   Pure hypercholesterolemia 06/07/2011   ALLERGIC RHINITIS 06/26/2007   GASTROESOPHAGEAL REFLUX DISEASE 06/26/2007   Diaphragmatic hernia 06/26/2007   VERTIGO 06/26/2007   DYSPHAGIA UNSPECIFIED 06/26/2007   ESOPHAGEAL STRICTURE 05/03/2007    ONSET DATE: 08/15/2021 (date of referral)  REFERRING DIAG: R42 (ICD-10-CM) - Dizziness and giddiness   THERAPY DIAG:   Dizziness and giddiness  Unsteadiness on feet  PERTINENT HISTORY: medical history significant of GERD, memory issues.  Pt presented to ED on 08/13/21  with increased falls, change in behavior, and worsening confusion.  MRI brain negative for acute CVA.  Work-up revealed acute metabolic encephalopathy secondary to presumptive UTI.    Per audiology note from 02/2021 from Dr. Zella Ball: Mrs. 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 This bilateral deficit is of unclear etiology. She denies ever being treated with IV antibiotics or antineoplastic medications. 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   PRECAUTIONS: Other: Fall, Chronic dizziness  SUBJECTIVE:  Reports dizziness has definitely gotten better. Has not been doing her exercises at home.   PAIN: Are you having pain?  No      OBJECTIVE:  VESTIBULAR TREATMENT:    Gaze Adaptation: x1 Viewing Horizontal: Position: Standing Reps: 2 x 60 seconds. Comment: cues for incr speed and keeping eyes on the X. Pt able to tolerate well. Mild dizziness.    and x1 Viewing Vertical:  Position: Standing Reps: 2 x 60 seconds. Comment: cues for incr speed and keeping eyes on the X. Pt able to tolerate well. Mild dizziness. Pt with difficulty keeping eyes on the  X at times.   Needing cues throughout for proper technique and head motion.   Standing Balance: Surface: Airex Position: Feet Hip Width Apart Completed with: Eyes Open and Eyes Closed;  With feet hip width distance static balance (when asked to try to have feet together pt adamant that she can't do this foot position); 3 x 30 seconds with EO static stance, 2 x 10 reps head turns, 2 x 10 reps head nods  With feet hip width 3 x 30 seconds EC, pt needing fingertip support intermittently during first 2 reps, but  improved with last rep.   On blue mat: tandem gait down and back x3 reps, pt needing to use fingertip support.    Rockerboard: In M/L direction: weight shifting x6 reps side to side with pt needing to use UE support for balance when shifting all the way to one side. Pt getting frustated and stating that she can't do it and then stepping off and heading back to mat and stating that she is done with exercises for the day.      PATIENT EDUCATION: Education details: Helped change pt's schedule around as pt will need to cancel Thursday's appt this week. Explained purpose of vestibular system for balance and how exercises in therapy are addressing that. Educated pt on why we are working on these exercises in therapy to challenge her brain/vestibular system to make changes for her balance as pt got easily frustrated with activities today.  Person educated: Patient and Spouse Education method: Explanation     HEP:   GE49MG AD  See pt instructions for VOR x1.   GOALS: Goals reviewed with patient? Yes   SHORT TERM GOALS: ALL STGS = LTGS     UPDATED/ONGOING LTGS FOR 8 WEEK POC  LONG TERM GOALS: Target date: 11/08/2021    Pt will be independent with initial HEP for vestibular/balance deficits.  Baseline: pt has only performed VOR x1 exercise at home once.  Goal status: ON-GOING   2.  Pt will demo resolution of R posterior canal BPPV in order to decr dizziness/unsteadiness for improved functional mobility.  Baseline: pt continues with R posterior canal BPPV  Goal status: ON-GOING   3.  Pt will improve condition 4 of mCTSIB to at least 12 seconds in order to demo improved vestibular input for balance.  Baseline: conditions 1-3: 30 seconds, condition 4: 5 seconds Goal status: ON-GOING   4.  Pt will improve FGA to at least a 24/30 in order to demo decr fall risk.  Baseline: 21/30 on 10/13/21 Goal status: NEW  5.  Pt will improve DPS to at least a 58 in order to demo improved functional  outcomes related to dizziness.  Baseline: 50; 53.2%  Goal status: ONGOING     ASSESSMENT:   CLINICAL IMPRESSION: Worked on progressing VOR x1 in standing - pt able to tolerate for 30 seconds with mild dizziness. Remainder of session focused on vestibular input for balance and on compliant surfaces. Pt frustrated during today's session and adamant that she can't do certain activities. Pt frustrated with rockerboard when trying to weight shift, pt stepping off and stating that she is done with exercises for the day. Continued to educate on purpose of working of these exercises in regards to pt's impairments. Will continue to progress towards LTGs.    OBJECTIVE IMPAIRMENTS Abnormal gait, decreased activity tolerance, decreased balance, difficulty walking, and dizziness.    ACTIVITY LIMITATIONS cleaning, community activity, driving, yard work, and play with her grandkids .  PERSONAL FACTORS Age, Behavior pattern, and Past/current experiences are also affecting patient's functional outcome.      REHAB POTENTIAL: Fair due to chronicity of condition, pt declining tx for BPPV at eval (and hx of declining tx for vestibular deficits/BPPV)    CLINICAL DECISION MAKING: Evolving/moderate complexity   EVALUATION COMPLEXITY: Moderate     PLAN: PT FREQUENCY: 2x/week   PT DURATION: 8 weeks   PLANNED INTERVENTIONS: Therapeutic exercises, Therapeutic activity, Neuromuscular re-education, Balance training, Gait training, Patient/Family education, Vestibular training, and Canalith repositioning   PLAN FOR NEXT SESSION: Progress VOR x1, re-assess R posterior canal BPPV as needed, balance with eyes closed, head motions, unlevel surfaces, tandem.      Drake Leach, PT, DPT 10/27/2021, 11:46 AM

## 2021-10-28 ENCOUNTER — Encounter: Payer: Self-pay | Admitting: Physical Therapy

## 2021-10-28 ENCOUNTER — Ambulatory Visit: Payer: Medicare PPO | Admitting: Physical Therapy

## 2021-10-28 DIAGNOSIS — R2681 Unsteadiness on feet: Secondary | ICD-10-CM

## 2021-10-28 DIAGNOSIS — R42 Dizziness and giddiness: Secondary | ICD-10-CM

## 2021-10-28 DIAGNOSIS — H8111 Benign paroxysmal vertigo, right ear: Secondary | ICD-10-CM | POA: Diagnosis not present

## 2021-10-28 NOTE — Therapy (Signed)
OUTPATIENT PHYSICAL THERAPY VESTIBULAR TREATMENT NOTE   Patient Name: [name] [name] MRN: [id number] DOB:1940/11/12, 81 y.o., female Today's Date: 10/28/2021  PCP: [name], MD REFERRING PROVIDER: Dow Adolph, DO    PT End of Session - 10/28/21 1619     Visit Number 14    Number of Visits 17    Date for PT Re-Evaluation 11/08/21    Authorization Type [employer]- 8 visits approved 10/13/21- 11/08/21    Progress Note Due on Visit 19   done on visit 9   PT Start Time 1617    PT Stop Time 1658    PT Time Calculation (min) 41 min    Activity Tolerance Patient tolerated treatment well    Behavior During Therapy WFL for tasks assessed/performed;Agitated               Past Medical History:  Diagnosis Date   Arthritis    Dizziness    Esophageal stricture    Gastroesophageal reflux disease    Hiatal hernia    Hiatal hernia    Varicose veins    Vertigo    chronic with exacerbation   Past Surgical History:  Procedure Laterality Date   BLADDER SUSPENSION     x 2   ENDOVENOUS ABLATION SAPHENOUS VEIN W/ LASER Right 09-05-2013   right greater saphenous vein by Gretta Began MD   ENDOVENOUS ABLATION SAPHENOUS VEIN W/ LASER Left 10-18-2013   endovenous laser ablation left greater saphenous vein by Gretta Began MD   VAGINAL HYSTERECTOMY     Patient Active Problem List   Diagnosis Date Noted   Acute metabolic encephalopathy 08/13/2021   Memory loss 08/13/2021   UTI (urinary tract infection) 08/13/2021   Varicose veins of lower extremities with other complications 05/11/2013   Swelling of limb 05/11/2013   Pure hypercholesterolemia 06/07/2011   ALLERGIC RHINITIS 06/26/2007   GASTROESOPHAGEAL REFLUX DISEASE 06/26/2007   Diaphragmatic hernia 06/26/2007   VERTIGO 06/26/2007   DYSPHAGIA UNSPECIFIED 06/26/2007   ESOPHAGEAL STRICTURE 05/03/2007    ONSET DATE: 08/15/2021 (date of referral)  REFERRING DIAG: R42 (ICD-10-CM) - Dizziness and giddiness   THERAPY DIAG:   Dizziness and giddiness  Unsteadiness on feet  PERTINENT HISTORY: medical history significant of GERD, memory issues.  Pt presented to ED on 08/13/21  with increased falls, change in behavior, and worsening confusion.  MRI brain negative for acute CVA.  Work-up revealed acute metabolic encephalopathy secondary to presumptive UTI.    Per audiology note from 02/2021 from Dr. Zella Ball: Mrs. Nylander exhibits a high-frequency vestibular ocular reflex deficit bilaterally as measured on vHIT testing. This bilateral deficit is of unclear etiology. She denies ever being treated with IV antibiotics or antineoplastic medications. 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   PRECAUTIONS: Other: Fall, Chronic dizziness  SUBJECTIVE:  Not having any dizziness.   PAIN: Are you having pain?  No      OBJECTIVE:  VESTIBULAR TREATMENT:  POSITIONAL TESTS: Right Sidelying: upbeating, right nystagmus; Duration: 5 seconds (very faint). Very mild dizziness.   When re-assessed after performing Austin Miles exercises, pt with no nystagmus or dizziness.    Performed Francee Piccolo Daroff x5 reps to each side with cues to turn head and pt's husband having to help bring her legs onto the bed, educated on purpose of this exercise and added to HEP to help for habituation/any lingering BPPV for home.   Standing Balance: Surface: Airex  Position: Feet Hip Width Apart Completed with: Eyes Closed;  3 x 30 seconds   Down hallway with fingertip support > none, forwards and retro gait over 20' with EC, with incr reps and distance, pt able to take larger steps with EC and able to self correct when pt felt that she was veering to the R.   Rockerboard: In A/P direction: 2 sets of 10 reps head turns, 2 sets of 10 reps head nods, intermittent taps to bars for balance.      PATIENT EDUCATION: Education  details: Importance of performing HEP and purpose of exercises, Austin Miles addition to HEP, explained reoccurrence rate of BPPV and if it does return in the future, then pt can always get a new referral to return.  Person educated: Patient and Spouse Education method: Explanation and Handout     HEP:   GE49MG AD  See pt instructions for VOR x1.   GOALS: Goals reviewed with patient? Yes   SHORT TERM GOALS: ALL STGS = LTGS     UPDATED/ONGOING LTGS FOR 8 WEEK POC  LONG TERM GOALS: Target date: 11/08/2021    Pt will be independent with initial HEP for vestibular/balance deficits.  Baseline: pt has only performed VOR x1 exercise at home once.  Goal status: ON-GOING   2.  Pt will demo resolution of R posterior canal BPPV in order to decr dizziness/unsteadiness for improved functional mobility.  Baseline: pt continues with R posterior canal BPPV  Goal status: ON-GOING   3.  Pt will improve condition 4 of mCTSIB to at least 12 seconds in order to demo improved vestibular input for balance.  Baseline: conditions 1-3: 30 seconds, condition 4: 5 seconds Goal status: ON-GOING   4.  Pt will improve FGA to at least a 24/30 in order to demo decr fall risk.  Baseline: 21/30 on 10/13/21 Goal status: NEW  5.  Pt will improve DPS to at least a 58 in order to demo improved functional outcomes related to dizziness.  Baseline: 50; 53.2%  Goal status: ONGOING     ASSESSMENT:   CLINICAL IMPRESSION: When assessing R sidelying test at start of session, pt with very slight R upbeating rotary nystagmus lasting ~5 seconds. Performed Austin Miles exercises with 5 reps each side, when re-assessing R sidelying test afterwards, pt with no nystagmus or dizziness. Provided these exercises as HEP for home. Remainder of session focused on standing balance with pt able to tolerate well. Will continue to progress towards LTGs.    OBJECTIVE IMPAIRMENTS Abnormal gait, decreased activity tolerance, decreased  balance, difficulty walking, and dizziness.    ACTIVITY LIMITATIONS cleaning, community activity, driving, yard work, and play with her grandkids .    PERSONAL FACTORS Age, Behavior pattern, and Past/current experiences are also affecting patient's functional outcome.      REHAB POTENTIAL: Fair due to chronicity of condition, pt declining tx for BPPV at eval (and hx of declining tx for vestibular deficits/BPPV)    CLINICAL DECISION MAKING: Evolving/moderate complexity   EVALUATION COMPLEXITY: Moderate     PLAN: PT FREQUENCY: 2x/week   PT DURATION: 8 weeks   PLANNED INTERVENTIONS: Therapeutic exercises, Therapeutic activity, Neuromuscular re-education, Balance training, Gait training, Patient/Family education, Vestibular training, and Canalith repositioning   PLAN FOR NEXT SESSION: Progress VOR x1, re-assess R posterior canal BPPV as needed, balance with eyes closed, head motions, unlevel surfaces, tandem. Start checking LTGs.      Drake Leach, PT, DPT 10/28/2021, 5:17 PM

## 2021-10-29 ENCOUNTER — Ambulatory Visit: Payer: Medicare PPO | Admitting: Physical Therapy

## 2021-11-02 ENCOUNTER — Ambulatory Visit: Payer: Medicare PPO | Attending: Internal Medicine | Admitting: Physical Therapy

## 2021-11-02 ENCOUNTER — Encounter: Payer: Self-pay | Admitting: Physical Therapy

## 2021-11-02 DIAGNOSIS — R42 Dizziness and giddiness: Secondary | ICD-10-CM | POA: Insufficient documentation

## 2021-11-02 DIAGNOSIS — R2681 Unsteadiness on feet: Secondary | ICD-10-CM | POA: Diagnosis not present

## 2021-11-02 NOTE — Therapy (Signed)
OUTPATIENT PHYSICAL THERAPY VESTIBULAR TREATMENT NOTE   Patient Name: [name] [name] MRN: [id number] DOB:[birth year], 81 y.o., female Today's Date: 11/02/2021  PCP: [name], MD REFERRING PROVIDER: Dow Adolph, DO    PT End of Session - 11/02/21 1105     Visit Number 15    Number of Visits 17    Date for PT Re-Evaluation 11/08/21    Authorization Type [employer]- 8 visits approved 10/13/21- 11/08/21    Progress Note Due on Visit 19   done on visit 9   PT Start Time 1102    PT Stop Time 1143    PT Time Calculation (min) 41 min    Activity Tolerance Patient tolerated treatment well    Behavior During Therapy WFL for tasks assessed/performed               Past Medical History:  Diagnosis Date   Arthritis    Dizziness    Esophageal stricture    Gastroesophageal reflux disease    Hiatal hernia    Hiatal hernia    Varicose veins    Vertigo    chronic with exacerbation   Past Surgical History:  Procedure Laterality Date   BLADDER SUSPENSION     x 2   ENDOVENOUS ABLATION SAPHENOUS VEIN W/ LASER Right 09-05-2013   right greater saphenous vein by Gretta Began MD   ENDOVENOUS ABLATION SAPHENOUS VEIN W/ LASER Left 10-18-2013   endovenous laser ablation left greater saphenous vein by Gretta Began MD   VAGINAL HYSTERECTOMY     Patient Active Problem List   Diagnosis Date Noted   Acute metabolic encephalopathy 08/13/2021   Memory loss 08/13/2021   UTI (urinary tract infection) 08/13/2021   Varicose veins of lower extremities with other complications 05/11/2013   Swelling of limb 05/11/2013   Pure hypercholesterolemia 06/07/2011   ALLERGIC RHINITIS 06/26/2007   GASTROESOPHAGEAL REFLUX DISEASE 06/26/2007   Diaphragmatic hernia 06/26/2007   VERTIGO 06/26/2007   DYSPHAGIA UNSPECIFIED 06/26/2007   ESOPHAGEAL STRICTURE 05/03/2007    ONSET DATE: 08/15/2021 (date of referral)  REFERRING DIAG: R42 (ICD-10-CM) - Dizziness and giddiness   THERAPY DIAG:  Dizziness  and giddiness  Unsteadiness on feet  PERTINENT HISTORY: medical history significant of GERD, memory issues.  Pt presented to ED on 08/13/21  with increased falls, change in behavior, and worsening confusion.  MRI brain negative for acute CVA.  Work-up revealed acute metabolic encephalopathy secondary to presumptive UTI.    Per audiology note from 02/2021 from Dr. Zella Ball: Mrs. Talsma exhibits a high-frequency vestibular ocular reflex deficit bilaterally as measured on vHIT testing. This bilateral deficit is of unclear etiology. She denies ever being treated with IV antibiotics or antineoplastic medications. 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   PRECAUTIONS: Other: Fall, Chronic dizziness  SUBJECTIVE:  Dizziness and balance is doing better.   PAIN: Are you having pain?  No      OBJECTIVE:  VESTIBULAR TREATMENT:   Reviewed from HEP given at last session;  Performed Francee Piccolo Daroff x4 reps to each side with cues to turn head and pt's husband having to help bring her legs onto the bed, pt with minimal dizziness in each position that subsides quickly, but improves with incr reps.   Gait with head turns and pt naming cards that pt held up to either pt's R or L, cues at times to maintain gait speed and not slow down  345' x 1, then with looking up > back to midline 230' x 1. Pt with no dizziness when performing.   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  With feet hip width: x10 reps head turns, x10 reps head nods, incr difficulty with head turns.    Gaze Adaptation: x1 Viewing Horizontal: Position: Standing with feet closer together. Reps: 2 x 60 seconds.  Comment: cues for incr speed and keeping eyes on the X. Pt able to tolerate well.    and x1 Viewing Vertical:  Position: Standing Reps: 2 x 60 seconds with feet closer together. Comment: cues for incr speed   Pt with better with technique today and incr speed. No dizziness noted.     PATIENT EDUCATION: Education details: Importance of performing Austin MilesBrandt Daroff daily, will plan to assess goals tomorrow and determine re-cert vs. D/C.  Person educated: Patient and Spouse Education method: Explanation     HEP:   GE49MG AD  See pt instructions for VOR x1.   GOALS: Goals reviewed with patient? Yes   SHORT TERM GOALS: ALL STGS = LTGS     UPDATED/ONGOING LTGS FOR 8 WEEK POC  LONG TERM GOALS: Target date: 11/08/2021    Pt will be independent with initial HEP for vestibular/balance deficits.  Baseline: pt has only performed VOR x1 exercise at home once.  Goal status: ON-GOING   2.  Pt will demo resolution of R posterior canal BPPV in order to decr dizziness/unsteadiness for improved functional mobility.  Baseline: pt continues with R posterior canal BPPV  Goal status: ON-GOING   3.  Pt will improve condition 4 of mCTSIB to at least 12 seconds in order to demo improved vestibular input for balance.  Baseline: conditions 1-3: 30 seconds, condition 4: 5 seconds Goal status: ON-GOING   4.  Pt will improve FGA to at least a 24/30 in order to demo decr fall risk.  Baseline: 21/30 on 10/13/21 Goal status: NEW  5.  Pt will improve DPS to at least a 58 in order to demo improved functional outcomes related to dizziness.  Baseline: 50; 53.2%  Goal status: ONGOING     ASSESSMENT:   CLINICAL IMPRESSION: Reviewed Austin MilesBrandt Daroff from HEP for habituation/full resolution of R posterior canalithiasis. Pt with mild dizziness that did not last long in each position, and it improved with incr reps. Remainder of session focused on progressing VOR and standing balance with EC/head motions. Pt with no  dizziness today with VOR x1 in standing for 60 seconds. Will continue to progress towards LTGs.    OBJECTIVE IMPAIRMENTS Abnormal gait, decreased activity tolerance, decreased balance, difficulty walking, and dizziness.    ACTIVITY LIMITATIONS cleaning, community activity, driving, yard work, and play with her grandkids .    PERSONAL FACTORS Age, Behavior pattern, and Past/current experiences are also affecting patient's functional outcome.      REHAB POTENTIAL: Fair due to chronicity of condition, pt declining tx for BPPV at eval (and hx of declining tx for vestibular deficits/BPPV)    CLINICAL DECISION MAKING: Evolving/moderate complexity   EVALUATION COMPLEXITY: Moderate     PLAN: PT FREQUENCY: 2x/week   PT DURATION: 8 weeks   PLANNED INTERVENTIONS: Therapeutic exercises, Therapeutic activity, Neuromuscular re-education, Balance training, Gait training, Patient/Family education, Vestibular training, and Canalith repositioning   PLAN  FOR NEXT SESSION: Check LTGs. D/C vs. Re-cert??    Drake Leach, PT, DPT 11/02/2021, 11:51 AM

## 2021-11-03 ENCOUNTER — Ambulatory Visit: Payer: Medicare PPO | Admitting: Physical Therapy

## 2021-11-04 ENCOUNTER — Ambulatory Visit: Payer: Medicare PPO | Admitting: Physical Therapy

## 2021-11-11 ENCOUNTER — Encounter: Payer: Self-pay | Admitting: Physical Therapy

## 2021-11-11 ENCOUNTER — Ambulatory Visit: Payer: Medicare PPO | Admitting: Physical Therapy

## 2021-11-11 DIAGNOSIS — R42 Dizziness and giddiness: Secondary | ICD-10-CM | POA: Diagnosis not present

## 2021-11-11 DIAGNOSIS — R2681 Unsteadiness on feet: Secondary | ICD-10-CM

## 2021-11-11 NOTE — Therapy (Signed)
OUTPATIENT PHYSICAL THERAPY VESTIBULAR TREATMENT NOTE/DISCHARGE SUMMARY/RE-CERT   Patient Name: [name] [name] MRN: [id number] DOB:02-Jan-1941, 81 y.o., female Today's Date: 11/11/2021  PCP: Shirline Frees, MD REFERRING PROVIDER: Irene Pap, DO    PT End of Session - 11/11/21 1533     Visit Number 16    Number of Visits 17    Date for PT Re-Evaluation 11/11/21    Authorization Type [employer]- 8 visits approved 10/13/21- 11/08/21    Progress Note Due on Visit 19   done on visit 9   PT Start Time 1532    PT Stop Time 1606   full time not used due to D/C visit   PT Time Calculation (min) 34 min    Activity Tolerance Patient tolerated treatment well    Behavior During Therapy WFL for tasks assessed/performed               Past Medical History:  Diagnosis Date   Arthritis    Dizziness    Esophageal stricture    Gastroesophageal reflux disease    Hiatal hernia    Hiatal hernia    Varicose veins    Vertigo    chronic with exacerbation   Past Surgical History:  Procedure Laterality Date   BLADDER SUSPENSION     x 2   ENDOVENOUS ABLATION SAPHENOUS VEIN W/ LASER Right 09-05-2013   right greater saphenous vein by Curt Jews MD   ENDOVENOUS ABLATION SAPHENOUS VEIN W/ LASER Left 10-18-2013   endovenous laser ablation left greater saphenous vein by Curt Jews MD   VAGINAL HYSTERECTOMY     Patient Active Problem List   Diagnosis Date Noted   Acute metabolic encephalopathy 96/75/9163   Memory loss 08/13/2021   UTI (urinary tract infection) 08/13/2021   Varicose veins of lower extremities with other complications 84/66/5993   Swelling of limb 05/11/2013   Pure hypercholesterolemia 06/07/2011   ALLERGIC RHINITIS 06/26/2007   GASTROESOPHAGEAL REFLUX DISEASE 06/26/2007   Diaphragmatic hernia 06/26/2007   VERTIGO 06/26/2007   DYSPHAGIA UNSPECIFIED 06/26/2007   ESOPHAGEAL STRICTURE 05/03/2007    ONSET DATE: 08/15/2021 (date of referral)  REFERRING DIAG: R42  (ICD-10-CM) - Dizziness and giddiness   THERAPY DIAG:  Dizziness and giddiness  Unsteadiness on feet  PERTINENT HISTORY: medical history significant of GERD, memory issues.  Pt presented to ED on 08/13/21  with increased falls, change in behavior, and worsening confusion.  MRI brain negative for acute CVA.  Work-up revealed acute metabolic encephalopathy secondary to presumptive UTI.    Per audiology note from 02/2021 from Dr. Rachell Cipro: Mrs. Katona exhibits a high-frequency vestibular ocular reflex deficit bilaterally as measured on vHIT testing. This bilateral deficit is of unclear etiology. She denies ever being treated with IV antibiotics or antineoplastic medications. 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   PRECAUTIONS: Other: Fall, Chronic dizziness  SUBJECTIVE: Doing good, dizziness is better. Has been busy so has not been able to do the exercises.   PAIN: Are you having pain?  No      OBJECTIVE:  VESTIBULAR TREATMENT:   [facility] PT Assessment - 11/11/21 1536       Functional Gait  Assessment   Gait assessed  Yes    Gait Level Surface Walks 20 ft in less than 7 sec but greater than 5.5 sec, uses assistive device, slower speed, mild gait deviations, or deviates 6-10 in outside of the 12 in walkway  width.   6.12 seconds   Change in Gait Speed Able to smoothly change walking speed without loss of balance or gait deviation. Deviate no more than 6 in outside of the 12 in walkway width.    Gait with Horizontal Head Turns Performs head turns smoothly with no change in gait. Deviates no more than 6 in outside 12 in walkway width    Gait with Vertical Head Turns Performs head turns with no change in gait. Deviates no more than 6 in outside 12 in walkway width.    Gait and Pivot Turn Pivot turns safely within 3 sec and stops quickly with no loss of balance.    Step Over  Obstacle Is able to step over 2 stacked shoe boxes taped together (9 in total height) without changing gait speed. No evidence of imbalance.    Gait with Narrow Base of Support Ambulates less than 4 steps heel to toe or cannot perform without assistance.    Gait with Eyes Closed Walks 20 ft, no assistive devices, good speed, no evidence of imbalance, normal gait pattern, deviates no more than 6 in outside 12 in walkway width. Ambulates 20 ft in less than 7 sec.   6.91   Ambulating Backwards Walks 20 ft, no assistive devices, good speed, no evidence for imbalance, normal gait    Steps Alternating feet, must use rail.    Total Score 25    FGA comment: 25/30 = Low fall risk.             Condition 4 of mCTSIB: 3 seconds.   FOTO: DPS: 63.55%   POSITIONAL TESTING: Right Sidelying: upbeating, right nystagmus - lasting approx. 5-8 seconds, very mild sx, pt not wanting to treat it today.     PATIENT EDUCATION: Education details: Results of LTGs, pt continuing with lingering R BPPV (pt did not want to treat it today), discussed importance of continuing with exercises esp Nestor Lewandowsky. Discussed in the future if pt has incr dizziness/spinning episodes then can get a new referral to return to PT.  Person educated: Patient and Spouse Education method: Explanation      HEP:  Verbally reviewed HEP with pt and pt's wife and importance of continuing to perform esp Nestor Lewandowsky as pt with lingering R BPPV that has not fully resolved with CRM.   Access Code: [email or web address] URL: [email or web address] Date: 11/11/2021 Prepared by: Janann August  Exercises - Standing Balance with Eyes Closed on Foam  - 1 x daily - 5 x weekly - 3 sets - 30 hold - Standing on Foam Pad  - 1 x daily - 5 x weekly - 2 sets - 10 reps - Tandem Walking with Counter Support  - 1 x daily - 5 x weekly - 3 sets - Brandt-Daroff Vestibular Exercise  - 1-2 x daily - 5 x weekly - 5 reps   See pt instructions for VOR  x1.    PHYSICAL THERAPY DISCHARGE SUMMARY  Visits from Start of Care: 16  Current functional level related to goals / functional outcomes: See LTGs/Clinical Assessment   Remaining deficits: Impaired balance (notably vestibular input), continued mild R posterior canalithiasis.    Education / Equipment: HEP   Patient agrees to discharge. Patient goals were partially met. Patient is being discharged due to being pleased with the current functional level.   GOALS: Goals reviewed with patient? Yes   SHORT TERM GOALS: ALL STGS = LTGS     UPDATED/ONGOING LTGS FOR 8 WEEK POC  LONG TERM GOALS: Target date: 11/08/2021    Pt will be independent with initial HEP for vestibular/balance deficits.  Baseline: pt has not been consistently performing at home, but has the handouts. Goal status: PARTIALLY MET   2.  Pt will demo resolution of R posterior canal BPPV in order to decr dizziness/unsteadiness for improved functional mobility.  Baseline: pt continues with very slight R upbeat rotary nystagmus, pt did not want to treat it today  Goal status: NOT MET    3.  Pt will improve condition 4 of mCTSIB to at least 12 seconds in order to demo improved vestibular input for balance.  Baseline: condition 4: 5 seconds  3 seconds on 11/11/21 Goal status: NOT MET    4.  Pt will improve FGA to at least a 24/30 in order to demo decr fall risk.  Baseline: 21/30 on 10/13/21; 25/30 on 11/11/21  Goal status: MET   5.  Pt will improve DPS to at least a 58 in order to demo improved functional outcomes related to dizziness.  Baseline: 50; 53.2%; 63.55% on 11/11/21 Goal status: MET    ASSESSMENT:   CLINICAL IMPRESSION: Checked pt's LTGs today with pt meeting 2 out of 5 LTGs. Pt improved FOTO score for dizziness, indicating improved functional outcomes and FGA to a 25/30 (previously 21/30). Pt partially met goal in regards to HEP (has not been consistently performing at home as they have been busy). Pt  and pt's spouse verbalize importance of continuing to perform. Pt did not meet 2 LTGs in regards to condition 4 of mCTSIB (indicating decr vestibular input for balance) and R posterior canalithiasis. Pt continues with very brief R upbeating rotary nystagmus in R sidelying position with very mild symptoms. Pt did not want to treat BPPV today, but has still had improvements in balance and functional mobility. Discussed importance of performing Nestor Lewandowsky at home to see if it will clear that way as her BPPV has been very stubborn. Pt is very pleased with her progress with therapy and reports significant improvements in balance/dizziness and wishes to be discharged at this time.    OBJECTIVE IMPAIRMENTS Abnormal gait, decreased activity tolerance, decreased balance, difficulty walking, and dizziness.    ACTIVITY LIMITATIONS cleaning, community activity, driving, yard work, and play with her grandkids .    PERSONAL FACTORS Age, Behavior pattern, and Past/current experiences are also affecting patient's functional outcome.      REHAB POTENTIAL: Fair due to chronicity of condition, pt declining tx for BPPV at eval (and hx of declining tx for vestibular deficits/BPPV)    CLINICAL DECISION MAKING: Evolving/moderate complexity   EVALUATION COMPLEXITY: Moderate     PLAN: PT FREQUENCY: 2x/week   PT DURATION: 8 weeks   PLANNED INTERVENTIONS: Therapeutic exercises, Therapeutic activity, Neuromuscular re-education, Balance training, Gait training, Patient/Family education, Vestibular training, and Canalith repositioning   PLAN FOR NEXT SESSION: D/C from PT    Arliss Journey, PT, DPT 11/11/2021, 4:15 PM

## 2022-01-08 DIAGNOSIS — Z01419 Encounter for gynecological examination (general) (routine) without abnormal findings: Secondary | ICD-10-CM | POA: Diagnosis not present

## 2022-01-08 DIAGNOSIS — Z1231 Encounter for screening mammogram for malignant neoplasm of breast: Secondary | ICD-10-CM | POA: Diagnosis not present

## 2022-01-08 DIAGNOSIS — N8111 Cystocele, midline: Secondary | ICD-10-CM | POA: Diagnosis not present

## 2022-01-20 ENCOUNTER — Other Ambulatory Visit: Payer: Self-pay | Admitting: Obstetrics and Gynecology

## 2022-01-20 DIAGNOSIS — N63 Unspecified lump in unspecified breast: Secondary | ICD-10-CM

## 2022-01-29 ENCOUNTER — Ambulatory Visit
Admission: RE | Admit: 2022-01-29 | Discharge: 2022-01-29 | Disposition: A | Discharge status: Home / Self Care | Payer: Medicare PPO | Source: Ambulatory Visit | Attending: Obstetrics and Gynecology | Admitting: Obstetrics and Gynecology

## 2022-01-29 DIAGNOSIS — N63 Unspecified lump in unspecified breast: Secondary | ICD-10-CM

## 2022-01-29 DIAGNOSIS — N6011 Diffuse cystic mastopathy of right breast: Secondary | ICD-10-CM | POA: Diagnosis not present

## 2022-01-31 ENCOUNTER — Other Ambulatory Visit: Payer: Self-pay | Admitting: Physician Assistant

## 2022-01-31 DIAGNOSIS — R1319 Other dysphagia: Secondary | ICD-10-CM

## 2022-01-31 DIAGNOSIS — K449 Diaphragmatic hernia without obstruction or gangrene: Secondary | ICD-10-CM

## 2022-04-20 DIAGNOSIS — Z23 Encounter for immunization: Secondary | ICD-10-CM | POA: Diagnosis not present

## 2022-05-27 ENCOUNTER — Other Ambulatory Visit (HOSPITAL_BASED_OUTPATIENT_CLINIC_OR_DEPARTMENT_OTHER): Payer: Self-pay

## 2022-05-27 MED ORDER — COMIRNATY 30 MCG/0.3ML IM SUSY
PREFILLED_SYRINGE | INTRAMUSCULAR | 0 refills | Status: AC
  Filled 2022-05-27: qty 0.3, 1d supply, fill #0

## 2022-05-28 ENCOUNTER — Other Ambulatory Visit (HOSPITAL_BASED_OUTPATIENT_CLINIC_OR_DEPARTMENT_OTHER): Payer: Self-pay

## 2022-07-19 DIAGNOSIS — R159 Full incontinence of feces: Secondary | ICD-10-CM | POA: Diagnosis not present

## 2022-07-29 DIAGNOSIS — K922 Gastrointestinal hemorrhage, unspecified: Secondary | ICD-10-CM | POA: Diagnosis not present

## 2022-07-29 DIAGNOSIS — R197 Diarrhea, unspecified: Secondary | ICD-10-CM | POA: Diagnosis not present

## 2022-07-29 DIAGNOSIS — R413 Other amnesia: Secondary | ICD-10-CM | POA: Diagnosis not present

## 2022-07-29 DIAGNOSIS — E7800 Pure hypercholesterolemia, unspecified: Secondary | ICD-10-CM | POA: Diagnosis not present

## 2022-07-29 DIAGNOSIS — K219 Gastro-esophageal reflux disease without esophagitis: Secondary | ICD-10-CM | POA: Diagnosis not present

## 2022-07-29 DIAGNOSIS — Z0000 Encounter for general adult medical examination without abnormal findings: Secondary | ICD-10-CM | POA: Diagnosis not present

## 2022-09-02 NOTE — Therapy (Signed)
OUTPATIENT PHYSICAL THERAPY VESTIBULAR EVALUATION     Patient Name: [name] [name] MRN: [id number] DOB:December 21, 1940, 82 y.o., female Today's Date: 09/03/2022  END OF SESSION:  PT End of Session - 09/03/22 1135     Visit Number 1    Number of Visits 9    Date for PT Re-Evaluation 10/03/22    Authorization Type [employer]    PT Start Time 0932    PT Stop Time 1012    PT Time Calculation (min) 40 min    Activity Tolerance Patient tolerated treatment well    Behavior During Therapy WFL for tasks assessed/performed             Past Medical History:  Diagnosis Date   Arthritis    Dizziness    Esophageal stricture    Gastroesophageal reflux disease    Hiatal hernia    Hiatal hernia    Varicose veins    Vertigo    chronic with exacerbation   Past Surgical History:  Procedure Laterality Date   BLADDER SUSPENSION     x 2   ENDOVENOUS ABLATION SAPHENOUS VEIN W/ LASER Right 09-05-2013   right greater saphenous vein by Gretta Began MD   ENDOVENOUS ABLATION SAPHENOUS VEIN W/ LASER Left 10-18-2013   endovenous laser ablation left greater saphenous vein by Gretta Began MD   VAGINAL HYSTERECTOMY     Patient Active Problem List   Diagnosis Date Noted   Acute metabolic encephalopathy 08/13/2021   Memory loss 08/13/2021   UTI (urinary tract infection) 08/13/2021   Varicose veins of lower extremities with other complications 05/11/2013   Swelling of limb 05/11/2013   Pure hypercholesterolemia 06/07/2011   ALLERGIC RHINITIS 06/26/2007   GASTROESOPHAGEAL REFLUX DISEASE 06/26/2007   Diaphragmatic hernia 06/26/2007   VERTIGO 06/26/2007   DYSPHAGIA UNSPECIFIED 06/26/2007   ESOPHAGEAL STRICTURE 05/03/2007    PCP: [name], MD  REFERRING PROVIDER: [name], MD   REFERRING DIAG: H81.13 (ICD-10-CM) - Benign paroxysmal vertigo, bilateral   THERAPY DIAG:  Dizziness and giddiness  Unsteadiness on feet  BPPV (benign paroxysmal positional vertigo), right  ONSET  DATE: 08/26/2022   Rationale for Evaluation and Treatment: Rehabilitation  SUBJECTIVE:   SUBJECTIVE STATEMENT: Reports still has some dizziness, but not like she used to. It most definitely has improved. Moving around too fast will make her dizzy. Needs some pillows when laying down in bed, otherwise can't lay down flat. Since she was here, dizziness was gone for a few months before it returned. Gets dizzy when bending over.   Pt accompanied by: significant other, [name]  PERTINENT HISTORY: PMH: hx of BPPV, chronic vertigo, HLD, memory loss  PAIN:  Are you having pain? No  PRECAUTIONS: None  WEIGHT BEARING RESTRICTIONS: No  FALLS: Has patient fallen in last 6 months? No  LIVING ENVIRONMENT: Lives with: lives with their spouse  PLOF: Independent  PATIENT GOALS: Wants to get rid of the dizziness   OBJECTIVE:   DIAGNOSTIC FINDINGS: MRI brain 08/13/21: Brain: No restricted diffusion to suggest acute or subacute infarct. No acute hemorrhage, mass, mass effect, or midline shift. No hydrocephalus or extra-axial collection. Scattered T2 hyperintense signal in the periventricular white matter, likely the sequela of mild chronic small vessel ischemic disease.  COGNITION: Overall cognitive status: Impaired   GAIT: Gait pattern:  Some unsteadiness and step through pattern Distance walked: Clinic distances Assistive device utilized: None Level of assistance: SBA Comments: Pt able to ambulate in and out of session with supervision despite pt  feeling dizzy, before and after treatment for BPPV   PATIENT SURVEYS:  FOTO DPS: 46, DFS: 38.8   VESTIBULAR ASSESSMENT:  GENERAL OBSERVATION: Ambulates in with no AD    SYMPTOM BEHAVIOR:  Subjective history: See above.   Non-Vestibular symptoms:  N/A  Type of dizziness: "Funny feeling in the head" and "World moves"  Frequency: Pretty frequently   Duration: "doesn't go away, just the way it feels"   Aggravating factors: Induced  by position change: lying supine and Induced by motion: bending down to the ground, turning body quickly, and turning head quickly  Relieving factors:  "chocolate"   Progression of symptoms: unchanged  OCULOMOTOR EXAM:  Ocular Alignment: normal  Ocular ROM: No Limitations  Spontaneous Nystagmus: absent  Gaze-Induced Nystagmus: absent  Smooth Pursuits: intact and feels a little worse, but not horrible   Saccades: intact and "constant feeling in her head"   VESTIBULAR - OCULAR REFLEX:   Slow VOR: Normal, makes head feel like its moving around   VOR Cancellation: Normal, feels worse, but not horrible   Head-Impulse Test: HIT Right: positive HIT Left: positive Pt reporting incr dizziness      POSITIONAL TESTING: Right Dix-Hallpike: upbeating, right nystagmus and lasting approx. 20 seconds  Left Dix-Hallpike: no nystagmus and pt reporting feeling dizzy in this position for approx 20- 30 seconds     VESTIBULAR TREATMENT:                                                                                                   DATE: 09/03/22  Canalith Repositioning:  Epley Right: Number of Reps: 1, Response to Treatment: comment: pt reporting feeling dizzy afterwards, unable to re-assess due to time constraints. Pt did report feeling somewhat better at end of session, and Comment: performed with 4" risers under mat table for incr extension.    PATIENT EDUCATION: Education details: Clinical findings, POC, return of BPPV and etiology/purpose of treatment with Epley  Person educated: Patient and Spouse Education method: Explanation Education comprehension: verbalized understanding and needs further education  HOME EXERCISE PROGRAM: [id number] AD, VOR x1       GOALS: Goals reviewed with patient? Yes  SHORT TERM GOALS: ALL STGS = LTGS   LONG TERM GOALS: Target date: 10/01/2022  Pt will be independent with initial HEP for vestibular/balance deficits.   Baseline:  Goal status: INITIAL  2.  Pt  will demo resolution of R posterior canal BPPV in order to decr dizziness/unsteadiness for improved functional mobility.   Baseline:  Goal status: INITIAL  3.  FGA to be assessed with goal written.  Baseline:  Goal status: INITIAL  4.  Pt will improve DFS to at least 57 in order to demo improved functional outcomes related to dizziness.  Baseline: 54 Goal status: INITIAL  ASSESSMENT:  CLINICAL IMPRESSION: Patient is a 82 year old female referred to Neuro OPPT for BPPV.  Pt is known to this clinic and well known to this therapist when pt was here in 2023.  Pt's PMH is significant for: hx of BPPV, chronic vertigo, HLD, memory loss. Pt reporting a return  of her dizziness, but not as bad as it has been in the past. The following deficits were present during the exam: dizziness with oculomotor testing, positive HIT bilaterally indicating impaired VOR, R upbeating rotary nystagmus in R DixHallpike indicating R posterior canalithiasis (what pt has been treated for in the past). Severity of nystagmus not as bad today compared to when pt was initially here in 2023. Treated with 1 rep of Epley maneuver with pt feeling dizzy afterwards, did not get to reassess due to time. Pt able to ambulate out of clinic with supervision.  Pt would benefit from skilled PT to address these impairments and functional limitations to maximize functional mobility independence and decr dizziness.    OBJECTIVE IMPAIRMENTS: decreased activity tolerance, decreased balance, decreased cognition, and dizziness.   ACTIVITY LIMITATIONS: bending, transfers, and locomotion level  PARTICIPATION LIMITATIONS: driving, shopping, and community activity  PERSONAL FACTORS: Age, Behavior pattern, Past/current experiences, Time since onset of injury/illness/exacerbation, and 3+ comorbidities: hx of BPPV, chronic vertigo, HLD, memory loss  are also affecting patient's functional outcome.   REHAB POTENTIAL: Good  CLINICAL DECISION MAKING:  Stable/uncomplicated  EVALUATION COMPLEXITY: Low   PLAN:  PT FREQUENCY: 2x/week  PT DURATION: 4 weeks  PLANNED INTERVENTIONS: Therapeutic exercises, Therapeutic activity, Neuromuscular re-education, Balance training, Gait training, Patient/Family education, Self Care, Vestibular training, Canalith repositioning, and Re-evaluation  PLAN FOR NEXT SESSION: Re-assess R posterior BPPV and treat with epley with risers or try semont, assess FGA when able. VOR exercises    Drake Leach, PT, DPT  09/03/2022, 11:36 AM

## 2022-09-03 ENCOUNTER — Ambulatory Visit: Payer: Medicare PPO | Attending: Family Medicine | Admitting: Physical Therapy

## 2022-09-03 DIAGNOSIS — H8111 Benign paroxysmal vertigo, right ear: Secondary | ICD-10-CM | POA: Insufficient documentation

## 2022-09-03 DIAGNOSIS — R42 Dizziness and giddiness: Secondary | ICD-10-CM | POA: Diagnosis not present

## 2022-09-03 DIAGNOSIS — R2681 Unsteadiness on feet: Secondary | ICD-10-CM | POA: Insufficient documentation

## 2022-09-08 ENCOUNTER — Ambulatory Visit: Payer: Medicare PPO | Admitting: Physical Therapy

## 2022-09-08 ENCOUNTER — Encounter: Payer: Self-pay | Admitting: Physical Therapy

## 2022-09-08 VITALS — BP 117/66 | HR 64

## 2022-09-08 DIAGNOSIS — H8111 Benign paroxysmal vertigo, right ear: Secondary | ICD-10-CM | POA: Diagnosis not present

## 2022-09-08 DIAGNOSIS — R2681 Unsteadiness on feet: Secondary | ICD-10-CM | POA: Diagnosis not present

## 2022-09-08 DIAGNOSIS — R42 Dizziness and giddiness: Secondary | ICD-10-CM

## 2022-09-08 NOTE — Therapy (Signed)
OUTPATIENT PHYSICAL THERAPY VESTIBULAR TREATMENT     Patient Name: [name] [name] MRN: [id number] DOB:1941/03/30, 82 y.o., female Today's Date: 09/08/2022  END OF SESSION:  PT End of Session - 09/08/22 1233     Visit Number 2    Number of Visits 9    Date for PT Re-Evaluation 10/03/22    Authorization Type [employer] - 9 PT visits approved 09/03/22 - 10/01/22    PT Start Time 1231    PT Stop Time 1313    PT Time Calculation (min) 42 min    Activity Tolerance Patient tolerated treatment well    Behavior During Therapy WFL for tasks assessed/performed             Past Medical History:  Diagnosis Date   Arthritis    Dizziness    Esophageal stricture    Gastroesophageal reflux disease    Hiatal hernia    Hiatal hernia    Varicose veins    Vertigo    chronic with exacerbation   Past Surgical History:  Procedure Laterality Date   BLADDER SUSPENSION     x 2   ENDOVENOUS ABLATION SAPHENOUS VEIN W/ LASER Right 09-05-2013   right greater saphenous vein by Gretta Beganodd Early MD   ENDOVENOUS ABLATION SAPHENOUS VEIN W/ LASER Left 10-18-2013   endovenous laser ablation left greater saphenous vein by Gretta Beganodd Early MD   VAGINAL HYSTERECTOMY     Patient Active Problem List   Diagnosis Date Noted   Acute metabolic encephalopathy 08/13/2021   Memory loss 08/13/2021   UTI (urinary tract infection) 08/13/2021   Varicose veins of lower extremities with other complications 05/11/2013   Swelling of limb 05/11/2013   Pure hypercholesterolemia 06/07/2011   ALLERGIC RHINITIS 06/26/2007   GASTROESOPHAGEAL REFLUX DISEASE 06/26/2007   Diaphragmatic hernia 06/26/2007   VERTIGO 06/26/2007   DYSPHAGIA UNSPECIFIED 06/26/2007   ESOPHAGEAL STRICTURE 05/03/2007    PCP: Johny BlamerHarris, William, MD  REFERRING PROVIDER: Johny BlamerHarris, William, MD   REFERRING DIAG: H81.13 (ICD-10-CM) - Benign paroxysmal vertigo, bilateral   THERAPY DIAG:  Dizziness and giddiness  BPPV (benign paroxysmal positional vertigo),  right  Unsteadiness on feet  ONSET DATE: 08/26/2022   Rationale for Evaluation and Treatment: Rehabilitation  SUBJECTIVE:   SUBJECTIVE STATEMENT: No changes in her dizziness since she was last here.   Pt accompanied by: significant other, [name]  PERTINENT HISTORY: PMH: hx of BPPV, chronic vertigo, HLD, memory loss  PAIN:  Are you having pain? No  Vitals:   09/08/22 1248  BP: 117/66  Pulse: 64     PRECAUTIONS: None  WEIGHT BEARING RESTRICTIONS: No  FALLS: Has patient fallen in last 6 months? No  LIVING ENVIRONMENT: Lives with: lives with their spouse  PLOF: Independent  PATIENT GOALS: Wants to get rid of the dizziness   OBJECTIVE:   DIAGNOSTIC FINDINGS: MRI brain 08/13/21: Brain: No restricted diffusion to suggest acute or subacute infarct. No acute hemorrhage, mass, mass effect, or midline shift. No hydrocephalus or extra-axial collection. Scattered T2 hyperintense signal in the periventricular white matter, likely the sequela of mild chronic small vessel ischemic disease.  COGNITION: Overall cognitive status: Impaired   GAIT: Gait pattern:  Some unsteadiness and step through pattern Distance walked: Clinic distances Assistive device utilized: None Level of assistance: SBA Comments: Pt able to ambulate in and out of session with supervision despite pt feeling dizzy, before and after treatment for BPPV   VESTIBULAR TREATMENT:  DATE: 09/08/22  Canalith Repositioning:  Epley Right: Number of Reps: 3, Response to Treatment: comment: pt reporting feeling dizzy when coming upright. Not as bad. With pt reporting feeling better than she came in., and Comment: performed with 4" risers under mat table for incr extension.   POSITIONAL TESTING: Right Dix-Hallpike: upbeating, right nystagmus and latency period of approx. 5-8 seconds, very mild, lasting approx 10  seconds. When re-assessed a 2nd and 3rd time, still the same latency, but nystagmus was more intense lasting about 10 seconds    Gaze Adaptation: x1 Viewing Horizontal: Position: Seated, Time: 30 seconds, Reps: 2, and Comment: Pt reporting no incr in symptoms, cues for proper head ROM and x1 Viewing Vertical:  Position: Seated, Time: 30 seconds, Reps: 1, and Comment: Pt with no report of incr symptoms  Reviewed from previous HEP for VOR as pt had not been performing, gave a new handout     PATIENT EDUCATION: Education details: Answered pt's questions throughout session regarding etiology of BPPV, reoccurrence, and how to treat. Pt reports incr neck stiffness at base of neck, pt reports that she currently sleeps with 2 pillows with her head in a flexed position. Discussed pt can try sleeping with one pillow to see if it helps. Handout for VOR x1 exercises for HEP  Person educated: Patient and Spouse Education method: Explanation, Demonstration, Verbal cues, and Handouts Education comprehension: verbalized understanding, returned demonstration, and needs further education  HOME EXERCISE PROGRAM: [email or web address] AD, VOR x1 seated x30 seconds       GOALS: Goals reviewed with patient? Yes  SHORT TERM GOALS: ALL STGS = LTGS   LONG TERM GOALS: Target date: 10/01/2022  Pt will be independent with initial HEP for vestibular/balance deficits.   Baseline:  Goal status: INITIAL  2.  Pt will demo resolution of R posterior canal BPPV in order to decr dizziness/unsteadiness for improved functional mobility.   Baseline:  Goal status: INITIAL  3.  FGA to be assessed with goal written.  Baseline:  Goal status: INITIAL  4.  Pt will improve DFS to at least 57 in order to demo improved functional outcomes related to dizziness.  Baseline: 54 Goal status: INITIAL  ASSESSMENT:  CLINICAL IMPRESSION: Pt continues with R posterior canal BPPV, indicated by R upbeating rotary nystagmus. Treated with 3 reps of  Epley maneuver, did not get to re-assess after 3rd rep. Pt very vague with reports of her dizziness, pt reporting feeling dizzy, but then will report feeling better. Pt reporting a funny feeling in her head. BP assessed and WNL. Remainder of session focused on re-issuing VOR x1 for gaze stabilization for home. Will continue to progress towards LTGs.    OBJECTIVE IMPAIRMENTS: decreased activity tolerance, decreased balance, decreased cognition, and dizziness.   ACTIVITY LIMITATIONS: bending, transfers, and locomotion level  PARTICIPATION LIMITATIONS: driving, shopping, and community activity  PERSONAL FACTORS: Age, Behavior pattern, Past/current experiences, Time since onset of injury/illness/exacerbation, and 3+ comorbidities: hx of BPPV, chronic vertigo, HLD, memory loss  are also affecting patient's functional outcome.   REHAB POTENTIAL: Good  CLINICAL DECISION MAKING: Stable/uncomplicated  EVALUATION COMPLEXITY: Low   PLAN:  PT FREQUENCY: 2x/week  PT DURATION: 4 weeks  PLANNED INTERVENTIONS: Therapeutic exercises, Therapeutic activity, Neuromuscular re-education, Balance training, Gait training, Patient/Family education, Self Care, Vestibular training, Canalith repositioning, and Re-evaluation  PLAN FOR NEXT SESSION: Re-assess R posterior BPPV and treat with epley with risers or try semont, assess FGA when able. VOR exercises    Drake Leach, PT, DPT  09/08/2022, 2:17 PM

## 2022-09-08 NOTE — Patient Instructions (Signed)
Gaze Stabilization: Sitting    Keeping eyes on target on wall a few feet away, tilt head down 15-30 and move head side to side for _30___ seconds. Repeat while moving head up and down for ___30_ seconds. Perform 3 sets of each.  Do __1-2__ sessions per day. Use a plain colored background.  Gaze Stabilization: Tip Card  1.Target must remain in focus, not blurry, and appear stationary while head is in motion. 2.Perform exercises with small head movements (45 to either side of midline). 3.Increase speed of head motion so long as target is in focus. 4.If you wear eyeglasses, be sure you can see target through lens (therapist will give specific instructions for bifocal / progressive lenses). 5.These exercises may provoke dizziness or nausea. Work through these symptoms. If too dizzy, slow head movement slightly. Rest between each exercise. 6.Exercises demand concentration; avoid distractions. 7.For safety, perform standing exercises close to a counter, wall, corner, or next to someone.

## 2022-09-10 ENCOUNTER — Ambulatory Visit: Payer: Medicare PPO | Admitting: Physical Therapy

## 2022-09-10 ENCOUNTER — Encounter: Payer: Self-pay | Admitting: Physical Therapy

## 2022-09-10 DIAGNOSIS — R2681 Unsteadiness on feet: Secondary | ICD-10-CM

## 2022-09-10 DIAGNOSIS — H8111 Benign paroxysmal vertigo, right ear: Secondary | ICD-10-CM | POA: Diagnosis not present

## 2022-09-10 DIAGNOSIS — R42 Dizziness and giddiness: Secondary | ICD-10-CM | POA: Diagnosis not present

## 2022-09-10 NOTE — Therapy (Signed)
OUTPATIENT PHYSICAL THERAPY VESTIBULAR TREATMENT     Patient Name: [name] [name] MRN: [id number] DOB:01/29/1941, 82 y.o., female Today's Date: 09/10/2022  END OF SESSION:  PT End of Session - 09/10/22 1233     Visit Number 3    Number of Visits 9    Date for PT Re-Evaluation 10/03/22    Authorization Type [employer] - 9 PT visits approved 09/03/22 - 10/01/22    PT Start Time 1231    PT Stop Time 1311    PT Time Calculation (min) 40 min    Activity Tolerance Patient tolerated treatment well    Behavior During Therapy WFL for tasks assessed/performed             Past Medical History:  Diagnosis Date   Arthritis    Dizziness    Esophageal stricture    Gastroesophageal reflux disease    Hiatal hernia    Hiatal hernia    Varicose veins    Vertigo    chronic with exacerbation   Past Surgical History:  Procedure Laterality Date   BLADDER SUSPENSION     x 2   ENDOVENOUS ABLATION SAPHENOUS VEIN W/ LASER Right 09-05-2013   right greater saphenous vein by Gretta Began MD   ENDOVENOUS ABLATION SAPHENOUS VEIN W/ LASER Left 10-18-2013   endovenous laser ablation left greater saphenous vein by Gretta Began MD   VAGINAL HYSTERECTOMY     Patient Active Problem List   Diagnosis Date Noted   Acute metabolic encephalopathy 08/13/2021   Memory loss 08/13/2021   UTI (urinary tract infection) 08/13/2021   Varicose veins of lower extremities with other complications 05/11/2013   Swelling of limb 05/11/2013   Pure hypercholesterolemia 06/07/2011   ALLERGIC RHINITIS 06/26/2007   GASTROESOPHAGEAL REFLUX DISEASE 06/26/2007   Diaphragmatic hernia 06/26/2007   VERTIGO 06/26/2007   DYSPHAGIA UNSPECIFIED 06/26/2007   ESOPHAGEAL STRICTURE 05/03/2007    PCP: [name], MD  REFERRING PROVIDER: [name], MD   REFERRING DIAG: H81.13 (ICD-10-CM) - Benign paroxysmal vertigo, bilateral   THERAPY DIAG:  Dizziness and giddiness  Unsteadiness on feet  BPPV (benign paroxysmal  positional vertigo), right  ONSET DATE: 08/26/2022   Rationale for Evaluation and Treatment: Rehabilitation  SUBJECTIVE:   SUBJECTIVE STATEMENT: Has not tried the VOR exercise since she was last here. Reports dizziness has gotten better since she was last here, but reports being in the car made it worse.   Pt accompanied by: significant other, [name]  PERTINENT HISTORY: PMH: hx of BPPV, chronic vertigo, HLD, memory loss  PAIN:  Are you having pain? No  There were no vitals filed for this visit.    PRECAUTIONS: None  WEIGHT BEARING RESTRICTIONS: No  FALLS: Has patient fallen in last 6 months? No  LIVING ENVIRONMENT: Lives with: lives with their spouse  PLOF: Independent  PATIENT GOALS: Wants to get rid of the dizziness   OBJECTIVE:   DIAGNOSTIC FINDINGS: MRI brain 08/13/21: Brain: No restricted diffusion to suggest acute or subacute infarct. No acute hemorrhage, mass, mass effect, or midline shift. No hydrocephalus or extra-axial collection. Scattered T2 hyperintense signal in the periventricular white matter, likely the sequela of mild chronic small vessel ischemic disease.  COGNITION: Overall cognitive status: Impaired   GAIT: Gait pattern:  Some unsteadiness and step through pattern Distance walked: Clinic distances Assistive device utilized: None Level of assistance: SBA Comments: Pt able to ambulate in and out of session with supervision before and after treatment for BPPV  VESTIBULAR TREATMENT:                                                                                                   DATE: 09/10/22  Canalith Repositioning:  Epley Right: Number of Reps: 3, Response to Treatment: comment: pt reporting feeling dizzy when coming upright. Not as bad. With pt reporting feeling better than she came in, and Comment: performed with 6" risers under mat table for incr extension, used a 2nd PT to help with rolling into sidelying as pt with difficulty  rolling    POSITIONAL TESTING: Right Dix-Hallpike: upbeating, right nystagmus and latency period of approx. 8 seconds, nystagmus lasting for approx. 10 seconds, more mild than before with pt reporting feeling dizzy, but not as intense as before.   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 Discussed importance of performing VOR x1 daily at home and purpose of this exercise  Person educated: Patient and Spouse Education method: Explanation, Demonstration, and Verbal cues Education comprehension: verbalized understanding, returned demonstration, and needs further education  HOME EXERCISE PROGRAM: [email or web address] AD, VOR x1 seated x30 seconds       GOALS: Goals reviewed with patient? Yes  SHORT TERM GOALS: ALL STGS = LTGS   LONG TERM GOALS: Target date: 10/01/2022  Pt will be independent with initial HEP for vestibular/balance deficits.   Baseline:  Goal status: INITIAL  2.  Pt will demo resolution of R posterior canal BPPV in order to decr dizziness/unsteadiness for improved functional mobility.   Baseline:  Goal status: INITIAL  3.  FGA to be assessed with goal written.  Baseline:  Goal status: INITIAL  4.  Pt will improve DFS to at least 57 in order to demo improved functional outcomes related to dizziness.  Baseline: 54 Goal status: INITIAL  ASSESSMENT:  CLINICAL IMPRESSION: Pt continues with R posterior canal BPPV,  indicated by R upbeating rotary nystagmus. Pt's nystagmus has a latency period and is not as intense as previous sessions. Treated with 3 reps of the Epley maneuver with use of 6" risers under mat table for incr extension. Pt with less intense symptoms during last rep and did not get to re-assess. Remainder of session focused on standing balance tasks and standing VOR for improved gaze stabilization. Encouraged pt to perform standing VOR daily. Will continue to progress towards LTGs.    OBJECTIVE IMPAIRMENTS: decreased activity tolerance, decreased balance, decreased cognition, and dizziness.   ACTIVITY LIMITATIONS: bending, transfers, and locomotion level  PARTICIPATION LIMITATIONS: driving, shopping, and community activity  PERSONAL FACTORS: Age, Behavior  pattern, Past/current experiences, Time since onset of injury/illness/exacerbation, and 3+ comorbidities: hx of BPPV, chronic vertigo, HLD, memory loss  are also affecting patient's functional outcome.   REHAB POTENTIAL: Good  CLINICAL DECISION MAKING: Stable/uncomplicated  EVALUATION COMPLEXITY: Low   PLAN:  PT FREQUENCY: 2x/week  PT DURATION: 4 weeks  PLANNED INTERVENTIONS: Therapeutic exercises, Therapeutic activity, Neuromuscular re-education, Balance training, Gait training, Patient/Family education, Self Care, Vestibular training, Canalith repositioning, and Re-evaluation  PLAN FOR NEXT SESSION: Re-assess R posterior BPPV and treat with epley with risers or try semont, assess FGA when able. VOR exercises    Drake Leach, PT, DPT  09/10/2022, 1:55 PM

## 2022-09-15 ENCOUNTER — Encounter: Payer: Self-pay | Admitting: Physical Therapy

## 2022-09-15 ENCOUNTER — Ambulatory Visit: Payer: Medicare PPO | Admitting: Physical Therapy

## 2022-09-15 DIAGNOSIS — R42 Dizziness and giddiness: Secondary | ICD-10-CM

## 2022-09-15 DIAGNOSIS — R2681 Unsteadiness on feet: Secondary | ICD-10-CM | POA: Diagnosis not present

## 2022-09-15 DIAGNOSIS — H8111 Benign paroxysmal vertigo, right ear: Secondary | ICD-10-CM

## 2022-09-15 NOTE — Therapy (Signed)
OUTPATIENT PHYSICAL THERAPY VESTIBULAR TREATMENT     Patient Name: [name] [name] MRN: [id number] DOB:[birth year], 82 y.o., female Today's Date: 09/15/2022  END OF SESSION:  PT End of Session - 09/15/22 1104     Visit Number 4    Number of Visits 9    Date for PT Re-Evaluation 10/03/22    Authorization Type [employer] - 9 PT visits approved 09/03/22 - 10/01/22    PT Start Time 1102    PT Stop Time 1142    PT Time Calculation (min) 40 min    Activity Tolerance Patient tolerated treatment well    Behavior During Therapy WFL for tasks assessed/performed             Past Medical History:  Diagnosis Date   Arthritis    Dizziness    Esophageal stricture    Gastroesophageal reflux disease    Hiatal hernia    Hiatal hernia    Varicose veins    Vertigo    chronic with exacerbation   Past Surgical History:  Procedure Laterality Date   BLADDER SUSPENSION     x 2   ENDOVENOUS ABLATION SAPHENOUS VEIN W/ LASER Right 09-05-2013   right greater saphenous vein by Gretta Began MD   ENDOVENOUS ABLATION SAPHENOUS VEIN W/ LASER Left 10-18-2013   endovenous laser ablation left greater saphenous vein by Gretta Began MD   VAGINAL HYSTERECTOMY     Patient Active Problem List   Diagnosis Date Noted   Acute metabolic encephalopathy 08/13/2021   Memory loss 08/13/2021   UTI (urinary tract infection) 08/13/2021   Varicose veins of lower extremities with other complications 05/11/2013   Swelling of limb 05/11/2013   Pure hypercholesterolemia 06/07/2011   ALLERGIC RHINITIS 06/26/2007   GASTROESOPHAGEAL REFLUX DISEASE 06/26/2007   Diaphragmatic hernia 06/26/2007   VERTIGO 06/26/2007   DYSPHAGIA UNSPECIFIED 06/26/2007   ESOPHAGEAL STRICTURE 05/03/2007    PCP: [name], MD  REFERRING PROVIDER: [name], MD   REFERRING DIAG: H81.13 (ICD-10-CM) - Benign paroxysmal vertigo, bilateral   THERAPY DIAG:  Dizziness and giddiness  Unsteadiness on feet  BPPV (benign paroxysmal  positional vertigo), right  ONSET DATE: 08/26/2022   Rationale for Evaluation and Treatment: Rehabilitation  SUBJECTIVE:   SUBJECTIVE STATEMENT: Has not tried the VOR exercise since she was last here. Feels like the dizziness is back. Reports it was better, but then it had returned.   Pt accompanied by: significant other, [name]  PERTINENT HISTORY: PMH: hx of BPPV, chronic vertigo, HLD, memory loss  PAIN:  Are you having pain? No  There were no vitals filed for this visit.    PRECAUTIONS: None  WEIGHT BEARING RESTRICTIONS: No  FALLS: Has patient fallen in last 6 months? No  LIVING ENVIRONMENT: Lives with: lives with their spouse  PLOF: Independent  PATIENT GOALS: Wants to get rid of the dizziness   OBJECTIVE:   DIAGNOSTIC FINDINGS: MRI brain 08/13/21: Brain: No restricted diffusion to suggest acute or subacute infarct. No acute hemorrhage, mass, mass effect, or midline shift. No hydrocephalus or extra-axial collection. Scattered T2 hyperintense signal in the periventricular white matter, likely the sequela of mild chronic small vessel ischemic disease.  COGNITION: Overall cognitive status: Impaired   GAIT: Gait pattern:  Some unsteadiness and step through pattern Distance walked: Clinic distances Assistive device utilized: None Level of assistance: SBA Comments: Pt able to ambulate in and out of session with supervision before and after treatment for BPPV    VESTIBULAR TREATMENT:  DATE: 09/15/22   POSITIONAL TESTING: Right Dix-Hallpike: no nystagmus Left Dix-Hallpike: no nystagmus, upbeating, left nystagmus, and initially very mild upbeating rotary nystagmus lasting approx. 5-8 seconds with a 10 second latency, pt reporting some spinning. Re-assessed 2 more times with no nystagmus or dizziness  Right Roll Test: no nystagmus Left Roll Test: no  nystagmus Right Sidelying: no nystagmus Left Sidelying: no nystagmus  Pt reporting dizziness/weird feeling when coming upright to sitting   Had discussion about pt's symptoms about how she reports more of a heaviness rather than a dizziness. Pt inconsistent about reporting symptoms during session. Pt initially reports a heaviness feeling in her head, but then reports it is more of an imbalance. Pt also then reports that heaviness in head has been feeling better. Discussed with pt and pt's spouse, that heaviness feeling sounds like it would be related to pt's dizziness, but if it continues, then it would be beneficial to reach out to physician regarding this.   NMR:   Reviewed from HEP as pt had not been performing at home:  Gaze Adaptation: x1 Viewing Horizontal: Position: Standing, Time: 30 seconds, Reps: 1, and Comment: pt reports no dizziness and x1 Viewing Vertical:  Position: Standing, Time: 30 seconds, Reps: 1, and Comment: Pt with no dizziness    Performed Austin Miles for habituation for sidelying <> sit, performed x5 reps each side. Pt reporting symptoms of dizziness/heaviness improved with incr reps. Discussed performing this for HEP and provided handout for home. Due to cognition, pt needing verbal cues for technique.    PATIENT EDUCATION: Education details: Resolution of BPPV, performing VOR and Francee Piccolo Daroff exercises for home and gave education on purpose of habituation exercises, following up with MD if heaviness in head feeling gets worse or lingers (pt reports heaviness has gotten better since coming to therapy, so feel like it is related to her dizziness)  Person educated: Patient and Spouse Education method: Explanation, Demonstration, and Verbal cues Education comprehension: verbalized understanding, returned demonstration, and needs further education  HOME EXERCISE PROGRAM: VOR x1 standing x30 seconds    Access Code: [email or web address] URL:  [email or web address] Date: 09/15/2022 Prepared by: Sherlie Ban  Exercises - Brandt-Daroff Vestibular Exercise  - 1-2 x daily - 5 x weekly - 3-4 reps  GOALS: Goals reviewed with patient? Yes  SHORT TERM GOALS: ALL STGS = LTGS   LONG TERM GOALS: Target date: 10/01/2022  Pt will be independent with initial HEP for vestibular/balance deficits.   Baseline:  Goal status: INITIAL  2.  Pt will demo resolution of R posterior canal BPPV in order to decr dizziness/unsteadiness for improved functional mobility.   Baseline:  Goal status: INITIAL  3.  FGA to be assessed with goal written.  Baseline:  Goal status: INITIAL  4.  Pt will improve DFS to at least 57 in order to demo improved functional outcomes related to dizziness.  Baseline: 54 Goal status: INITIAL  ASSESSMENT:  CLINICAL IMPRESSION: Pt demonstrates negative positional testing with assessment, indicating resolution of BPPV.  Pt initially demonstrating L upbeating rotary nystagmus in L DixHallpike, but subsided quickly. With further re-assessment, pt with no nystagmus/dizziness. Pt just with dizziness/heavy feeling in head when returning to upright from sidelying position. Performed Austin Miles exercises for habituation and gave these to pt for home as pt reporting improvement in symptoms. Pt very vague/inconsistent with symptoms and will sometimes report dizziness, a heavy feeling in head, or feeling more off balance. Pt does overall report dizziness and the heavy feeling in her  head has improved since starting therapy.  Will continue to progress towards LTGs.    OBJECTIVE IMPAIRMENTS: decreased activity tolerance, decreased balance, decreased cognition, and dizziness.   ACTIVITY LIMITATIONS: bending, transfers, and locomotion level  PARTICIPATION LIMITATIONS: driving, shopping, and community activity  PERSONAL FACTORS: Age, Behavior pattern, Past/current experiences, Time since onset of  injury/illness/exacerbation, and 3+ comorbidities: hx of BPPV, chronic vertigo, HLD, memory loss  are also affecting patient's functional outcome.   REHAB POTENTIAL: Good  CLINICAL DECISION MAKING: Stable/uncomplicated  EVALUATION COMPLEXITY: Low   PLAN:  PT FREQUENCY: 2x/week  PT DURATION: 4 weeks  PLANNED INTERVENTIONS: Therapeutic exercises, Therapeutic activity, Neuromuscular re-education, Balance training, Gait training, Patient/Family education, Self Care, Vestibular training, Canalith repositioning, and Re-evaluation  PLAN FOR NEXT SESSION: Re-assess R posterior BPPV and treat with epley with risers or try semont, assess FGA when able. VOR exercises    Drake Leach, PT, DPT  09/15/2022, 12:24 PM

## 2022-09-17 ENCOUNTER — Ambulatory Visit: Payer: Medicare PPO | Admitting: Physical Therapy

## 2022-09-17 ENCOUNTER — Encounter: Payer: Self-pay | Admitting: Physical Therapy

## 2022-09-17 DIAGNOSIS — H8111 Benign paroxysmal vertigo, right ear: Secondary | ICD-10-CM | POA: Diagnosis not present

## 2022-09-17 DIAGNOSIS — R2681 Unsteadiness on feet: Secondary | ICD-10-CM | POA: Diagnosis not present

## 2022-09-17 DIAGNOSIS — R42 Dizziness and giddiness: Secondary | ICD-10-CM | POA: Diagnosis not present

## 2022-09-17 NOTE — Therapy (Signed)
OUTPATIENT PHYSICAL THERAPY VESTIBULAR TREATMENT     Patient Name: [name] [name] MRN: [id number] DOB:24-Feb-1941, 82 y.o., female Today's Date: 09/17/2022  END OF SESSION:  PT End of Session - 09/17/22 0930     Visit Number 5    Number of Visits 9    Date for PT Re-Evaluation 10/03/22    Authorization Type [employer] - 9 PT visits approved 09/03/22 - 10/01/22    PT Start Time 0928    PT Stop Time 1013    PT Time Calculation (min) 45 min    Equipment Utilized During Treatment Gait belt    Activity Tolerance Patient tolerated treatment well    Behavior During Therapy WFL for tasks assessed/performed             Past Medical History:  Diagnosis Date   Arthritis    Dizziness    Esophageal stricture    Gastroesophageal reflux disease    Hiatal hernia    Hiatal hernia    Varicose veins    Vertigo    chronic with exacerbation   Past Surgical History:  Procedure Laterality Date   BLADDER SUSPENSION     x 2   ENDOVENOUS ABLATION SAPHENOUS VEIN W/ LASER Right 09-05-2013   right greater saphenous vein by Gretta Began MD   ENDOVENOUS ABLATION SAPHENOUS VEIN W/ LASER Left 10-18-2013   endovenous laser ablation left greater saphenous vein by Gretta Began MD   VAGINAL HYSTERECTOMY     Patient Active Problem List   Diagnosis Date Noted   Acute metabolic encephalopathy 08/13/2021   Memory loss 08/13/2021   UTI (urinary tract infection) 08/13/2021   Varicose veins of lower extremities with other complications 05/11/2013   Swelling of limb 05/11/2013   Pure hypercholesterolemia 06/07/2011   ALLERGIC RHINITIS 06/26/2007   GASTROESOPHAGEAL REFLUX DISEASE 06/26/2007   Diaphragmatic hernia 06/26/2007   VERTIGO 06/26/2007   DYSPHAGIA UNSPECIFIED 06/26/2007   ESOPHAGEAL STRICTURE 05/03/2007    PCP: [name], MD  REFERRING PROVIDER: [name], MD   REFERRING DIAG: H81.13 (ICD-10-CM) - Benign paroxysmal vertigo, bilateral   THERAPY DIAG:  Dizziness and  giddiness  Unsteadiness on feet  BPPV (benign paroxysmal positional vertigo), right  ONSET DATE: 08/26/2022   Rationale for Evaluation and Treatment: Rehabilitation  SUBJECTIVE:   SUBJECTIVE STATEMENT: Doing good, dizziness is feeling better. Has tried the exercises at home, feel like they help.   Pt accompanied by: significant other, [name]  PERTINENT HISTORY: PMH: hx of BPPV, chronic vertigo, HLD, memory loss  PAIN:  Are you having pain? No  There were no vitals filed for this visit.   PRECAUTIONS: None  WEIGHT BEARING RESTRICTIONS: No  FALLS: Has patient fallen in last 6 months? No  LIVING ENVIRONMENT: Lives with: lives with their spouse  PLOF: Independent  PATIENT GOALS: Wants to get rid of the dizziness   OBJECTIVE:   DIAGNOSTIC FINDINGS: MRI brain 08/13/21: Brain: No restricted diffusion to suggest acute or subacute infarct. No acute hemorrhage, mass, mass effect, or midline shift. No hydrocephalus or extra-axial collection. Scattered T2 hyperintense signal in the periventricular white matter, likely the sequela of mild chronic small vessel ischemic disease.  COGNITION: Overall cognitive status: Impaired   GAIT: Gait pattern:  Some unsteadiness and step through pattern Distance walked: Clinic distances Assistive device utilized: None Level of assistance: SBA Comments: Pt able to ambulate in and out of session with supervision before and after treatment for BPPV    VESTIBULAR TREATMENT:  DATE: 09/17/22   POSITIONAL TESTING: Right Dix-Hallpike: upbeating, right nystagmus and latency period of about 5-8 seconds, mild nystagmus lasting ~10 seconds, pt with mild symptoms   Canalith Repositioning: Epley Right: Number of Reps: 2, Response to Treatment: comment: Symptoms improved  and Comment: performed with 6" risers under mat table for incr extension,  pt's spouse helping with rolling into position 3   Pt not wishing to treat it again today or having PT re-assess BPPV. Pt reports overall feeling better.     [facility] PT Assessment - 09/17/22 0948       Functional Gait  Assessment   Gait assessed  Yes    Gait Level Surface Walks 20 ft in less than 7 sec but greater than 5.5 sec, uses assistive device, slower speed, mild gait deviations, or deviates 6-10 in outside of the 12 in walkway width.   6.3   Change in Gait Speed Able to smoothly change walking speed without loss of balance or gait deviation. Deviate no more than 6 in outside of the 12 in walkway width.    Gait with Horizontal Head Turns Performs head turns smoothly with no change in gait. Deviates no more than 6 in outside 12 in walkway width    Gait with Vertical Head Turns Performs head turns with no change in gait. Deviates no more than 6 in outside 12 in walkway width.    Gait and Pivot Turn Pivot turns safely within 3 sec and stops quickly with no loss of balance.    Step Over Obstacle Is able to step over 2 stacked shoe boxes taped together (9 in total height) without changing gait speed. No evidence of imbalance.    Gait with Narrow Base of Support Ambulates less than 4 steps heel to toe or cannot perform without assistance.    Gait with Eyes Closed Walks 20 ft, slow speed, abnormal gait pattern, evidence for imbalance, deviates 10-15 in outside 12 in walkway width. Requires more than 9 sec to ambulate 20 ft.   10.3 seconds   Ambulating Backwards Walks 20 ft, uses assistive device, slower speed, mild gait deviations, deviates 6-10 in outside 12 in walkway width.    Steps Alternating feet, no rail.    Total Score 23    FGA comment: 23/30 = Moderate Fall Risk              NMR:   On air ex with EC: Static stance with feet hip width > more narrow BOS: 2 x 30 seconds On blue mat for compliant surface with EC: Static marching 2 x 10 reps Forwards/backwards walking down and  back x3 reps    PATIENT EDUCATION: Education details: Continued R posterior canal BPPV but symptoms have been improving, results of FGA, purpose of vestibular system for balance and what is being worked on with balance with EC, importance of performing HEP at home and addition of balance with EC.  Person educated: Patient and Spouse Education method: Explanation, Demonstration, and Verbal cues Education comprehension: verbalized understanding, returned demonstration, and needs further education  HOME EXERCISE PROGRAM: VOR x1 standing x30 seconds    Access Code: [email or web address] AD URL: [email or web address] Date: 09/17/2022 Prepared by: Sherlie Ban  Exercises - Brandt-Daroff Vestibular Exercise  - 1-2 x daily - 5 x weekly - 3-4 reps - Standing Balance with Eyes Closed on Foam  - 1-2 x daily - 5 x weekly - 3 sets - 30 hold  GOALS: Goals reviewed with patient? Yes  SHORT TERM GOALS:  ALL STGS = LTGS   LONG TERM GOALS: Target date: 10/01/2022  Pt will be independent with initial HEP for vestibular/balance deficits.   Baseline:  Goal status: INITIAL  2.  Pt will demo resolution of R posterior canal BPPV in order to decr dizziness/unsteadiness for improved functional mobility.   Baseline:  Goal status: INITIAL  3.  Pt will improve FGA to at least a 25/30 in order to demo decr fall risk.  Baseline: 23/30 Goal status: INITIAL  4.  Pt will improve DFS to at least 57 in order to demo improved functional outcomes related to dizziness.  Baseline: 54 Goal status: INITIAL  ASSESSMENT:  CLINICAL IMPRESSION: Pt continues with R posterior canalithiasis as noted by R upbeating rotary nystagmus in R DixHallpike. Treated with 2 reps of the Epley with 6" risers underneath mat table for incr extension. Pt with very mild symptoms and reporting overall dizziness is getting better. After 2nd rep of Epley, pt not wishing to re-assess or treat again due to feeling better. Performed the FGA  and pt scoring a 23/30, indicating a moderate fall risk. Updated LTG. Remainder of session focused on vestibular input with balance with EC. Added this to pt's HEP. Pt tolerating session well with pt reporting dizziness is getting better. Will continue to progress towards LTGs.    OBJECTIVE IMPAIRMENTS: decreased activity tolerance, decreased balance, decreased cognition, and dizziness.   ACTIVITY LIMITATIONS: bending, transfers, and locomotion level  PARTICIPATION LIMITATIONS: driving, shopping, and community activity  PERSONAL FACTORS: Age, Behavior pattern, Past/current experiences, Time since onset of injury/illness/exacerbation, and 3+ comorbidities: hx of BPPV, chronic vertigo, HLD, memory loss  are also affecting patient's functional outcome.   REHAB POTENTIAL: Good  CLINICAL DECISION MAKING: Stable/uncomplicated  EVALUATION COMPLEXITY: Low   PLAN:  PT FREQUENCY: 2x/week  PT DURATION: 4 weeks  PLANNED INTERVENTIONS: Therapeutic exercises, Therapeutic activity, Neuromuscular re-education, Balance training, Gait training, Patient/Family education, Self Care, Vestibular training, Canalith repositioning, and Re-evaluation  PLAN FOR NEXT SESSION: Re-assess R posterior BPPV and treat with epley with risers or try semont, VOR exercises, balance wth EC    Drake Leach, PT, DPT  09/17/2022, 10:29 AM

## 2022-09-22 ENCOUNTER — Ambulatory Visit: Payer: Medicare PPO | Admitting: Physical Therapy

## 2022-09-22 ENCOUNTER — Encounter: Payer: Self-pay | Admitting: Physical Therapy

## 2022-09-22 DIAGNOSIS — R42 Dizziness and giddiness: Secondary | ICD-10-CM | POA: Diagnosis not present

## 2022-09-22 DIAGNOSIS — R2681 Unsteadiness on feet: Secondary | ICD-10-CM | POA: Diagnosis not present

## 2022-09-22 DIAGNOSIS — H8111 Benign paroxysmal vertigo, right ear: Secondary | ICD-10-CM

## 2022-09-22 NOTE — Therapy (Signed)
OUTPATIENT PHYSICAL THERAPY VESTIBULAR TREATMENT     Patient Name: [name] [name] MRN: [id number] DOB:10/28/1940, 82 y.o., female Today's Date: 09/22/2022  END OF SESSION:  PT End of Session - 09/22/22 1105     Visit Number 6    Number of Visits 9    Date for PT Re-Evaluation 10/03/22    Authorization Type [employer] - 9 PT visits approved 09/03/22 - 10/01/22    PT Start Time 1103    PT Stop Time 1143    PT Time Calculation (min) 40 min    Equipment Utilized During Treatment --    Activity Tolerance Patient tolerated treatment well    Behavior During Therapy WFL for tasks assessed/performed             Past Medical History:  Diagnosis Date   Arthritis    Dizziness    Esophageal stricture    Gastroesophageal reflux disease    Hiatal hernia    Hiatal hernia    Varicose veins    Vertigo    chronic with exacerbation   Past Surgical History:  Procedure Laterality Date   BLADDER SUSPENSION     x 2   ENDOVENOUS ABLATION SAPHENOUS VEIN W/ LASER Right 09-05-2013   right greater saphenous vein by Gretta Began MD   ENDOVENOUS ABLATION SAPHENOUS VEIN W/ LASER Left 10-18-2013   endovenous laser ablation left greater saphenous vein by Gretta Began MD   VAGINAL HYSTERECTOMY     Patient Active Problem List   Diagnosis Date Noted   Acute metabolic encephalopathy 08/13/2021   Memory loss 08/13/2021   UTI (urinary tract infection) 08/13/2021   Varicose veins of lower extremities with other complications 05/11/2013   Swelling of limb 05/11/2013   Pure hypercholesterolemia 06/07/2011   ALLERGIC RHINITIS 06/26/2007   GASTROESOPHAGEAL REFLUX DISEASE 06/26/2007   Diaphragmatic hernia 06/26/2007   VERTIGO 06/26/2007   DYSPHAGIA UNSPECIFIED 06/26/2007   ESOPHAGEAL STRICTURE 05/03/2007    PCP: [name], MD  REFERRING PROVIDER: [name], MD   REFERRING DIAG: H81.13 (ICD-10-CM) - Benign paroxysmal vertigo, bilateral   THERAPY DIAG:  Dizziness and  giddiness  Unsteadiness on feet  BPPV (benign paroxysmal positional vertigo), right  ONSET DATE: 08/26/2022   Rationale for Evaluation and Treatment: Rehabilitation  SUBJECTIVE:   SUBJECTIVE STATEMENT: Feels like her dizziness is definitely getting better. Had a couple of dizziness spells over the weekend, but they are not nearly as bad. Has not done the exercises. Not sure when dizziness will happen.   Pt accompanied by: significant other, Husband [name] [name]  PERTINENT HISTORY: PMH: hx of BPPV, chronic vertigo, HLD, memory loss  PAIN:  Are you having pain? No  There were no vitals filed for this visit.   PRECAUTIONS: None  WEIGHT BEARING RESTRICTIONS: No  FALLS: Has patient fallen in last 6 months? No  LIVING ENVIRONMENT: Lives with: lives with their spouse  PLOF: Independent  PATIENT GOALS: Wants to get rid of the dizziness   OBJECTIVE:   DIAGNOSTIC FINDINGS: MRI brain 08/13/21: Brain: No restricted diffusion to suggest acute or subacute infarct. No acute hemorrhage, mass, mass effect, or midline shift. No hydrocephalus or extra-axial collection. Scattered T2 hyperintense signal in the periventricular white matter, likely the sequela of mild chronic small vessel ischemic disease.  COGNITION: Overall cognitive status: Impaired   GAIT: Gait pattern:  Some unsteadiness and step through pattern Distance walked: Clinic distances Assistive device utilized: None Level of assistance: SBA Comments: Pt able to ambulate in and out of  session with supervision before and after treatment for BPPV    VESTIBULAR TREATMENT:                                                                                                   DATE: 09/22/22   POSITIONAL TESTING: Right Dix-Hallpike: upbeating, right nystagmus and latency period of about 5-8 seconds, mild nystagmus lasting ~10 seconds, with pt reporting very mild symptoms.    Canalith Repositioning: Epley Right: Number of Reps: 1,  Response to Treatment: comment: Symptoms resolved  and Comment: performed with 6" risers under mat table for incr extension, pt's spouse helping with rolling into position 3   Re-assessed x2 reps after 1 rep of Epley with pt demonstrating resolution of BPPV.    MOTION SENSITIVITY:  Motion Sensitivity Quotient Intensity: 0 = none, 1 = Lightheaded, 2 = Mild, 3 = Moderate, 4 = Severe, 5 = Vomiting  Intensity  1. Sitting to supine   2. Supine to L side   3. Supine to R side   4. Supine to sitting   5. L Hallpike-Dix   6. Up from L    7. R Hallpike-Dix   8. Up from R    9. Sitting, head tipped to L knee 0  10. Head up from L knee 0  11. Standing bending, head tipped to R knee 2  12. Standing coming back from upright, Head up from R knee 2  13. Sitting head turns x5 2 (not like it was)  14.Sitting head nods x5 2 (not like it was)  15. In stance, 180 turn to L  2  16. In stance, 180 turn to R 2      NMR:   On air ex, sit <> stands  5 reps with EO 2 sets of 5 reps with EC (pt bracing with BLE against mat at times), pt needing cues with incr forward lead  Bending habituation, 5 sets of 5 reps with picking up 5 cones, pt reporting initial mild dizziness that improved with each rep Gait with quick turns to R/L, ambulating a short distance and turning x5 reps each side, mild dizziness > none, pt reports liking this exercise and wants to do it at home    PATIENT EDUCATION: Education details: Resolution of R posterior canal BPPV, importance of doing exercises at home, provided handout on BPPV and answered pt's questions regarding BPPV  Person educated: Patient and Spouse Education method: Explanation, Demonstration, and Verbal cues Education comprehension: verbalized understanding, returned demonstration, and needs further education  HOME EXERCISE PROGRAM: VOR x1 standing x30 seconds Walking with quick turns     Access Code: [email or web address] AD URL:  [email or web address] Date: 09/17/2022 Prepared by: Sherlie Ban  Exercises - Brandt-Daroff Vestibular Exercise  - 1-2 x daily - 5 x weekly - 3-4 reps - Standing Balance with Eyes Closed on Foam  - 1-2 x daily - 5 x weekly - 3 sets - 30 hold  GOALS: Goals reviewed with patient? Yes  SHORT TERM GOALS: ALL STGS = LTGS   LONG TERM GOALS: Target date: 10/01/2022  Pt will  be independent with initial HEP for vestibular/balance deficits.   Baseline:  Goal status: INITIAL  2.  Pt will demo resolution of R posterior canal BPPV in order to decr dizziness/unsteadiness for improved functional mobility.   Baseline:  Goal status: INITIAL  3.  Pt will improve FGA to at least a 25/30 in order to demo decr fall risk.  Baseline: 23/30 Goal status: INITIAL  4.  Pt will improve DFS to at least 57 in order to demo improved functional outcomes related to dizziness.  Baseline: 54 Goal status: INITIAL  ASSESSMENT:  CLINICAL IMPRESSION: Pt continues with R posterior canalithiasis as noted by R upbeating rotary nystagmus in R DixHallpike. Treated with 1 rep of Epley maneuver with pt demonstrating resolution of symptoms afterwards and no nystagmus. Pt also reporting feeling much better. Remainder of session focused on working on motion sensitivities based on MSQ and EC tasks. Pt initially reporting mild dizziness with turning and bending down, but improved with incr reps. Will continue per POC.    OBJECTIVE IMPAIRMENTS: decreased activity tolerance, decreased balance, decreased cognition, and dizziness.   ACTIVITY LIMITATIONS: bending, transfers, and locomotion level  PARTICIPATION LIMITATIONS: driving, shopping, and community activity  PERSONAL FACTORS: Age, Behavior pattern, Past/current experiences, Time since onset of injury/illness/exacerbation, and 3+ comorbidities: hx of BPPV, chronic vertigo, HLD, memory loss  are also affecting patient's functional outcome.   REHAB  POTENTIAL: Good  CLINICAL DECISION MAKING: Stable/uncomplicated  EVALUATION COMPLEXITY: Low   PLAN:  PT FREQUENCY: 2x/week  PT DURATION: 4 weeks  PLANNED INTERVENTIONS: Therapeutic exercises, Therapeutic activity, Neuromuscular re-education, Balance training, Gait training, Patient/Family education, Self Care, Vestibular training, Canalith repositioning, and Re-evaluation  PLAN FOR NEXT SESSION: Re-assess R posterior BPPV and treat with epley with risers (6" works best) VOR exercises, balance wth EC    Drake Leach, PT, DPT  09/22/2022, 12:00 PM

## 2022-09-24 ENCOUNTER — Ambulatory Visit: Payer: Medicare PPO | Admitting: Physical Therapy

## 2022-09-29 ENCOUNTER — Encounter: Payer: Self-pay | Admitting: Physical Therapy

## 2022-09-29 ENCOUNTER — Ambulatory Visit: Payer: Medicare PPO | Attending: Family Medicine | Admitting: Physical Therapy

## 2022-09-29 DIAGNOSIS — R42 Dizziness and giddiness: Secondary | ICD-10-CM | POA: Diagnosis not present

## 2022-09-29 DIAGNOSIS — R2681 Unsteadiness on feet: Secondary | ICD-10-CM | POA: Diagnosis not present

## 2022-09-29 NOTE — Therapy (Addendum)
OUTPATIENT PHYSICAL THERAPY VESTIBULAR TREATMENT     Patient Name: [name] [name] MRN: [id number] DOB:01/01/1941, 82 y.o., female Today's Date: 09/29/2022  END OF SESSION:  PT End of Session - 09/29/22 1015     Visit Number 7    Number of Visits 9    Date for PT Re-Evaluation 10/03/22    Authorization Type [employer] - 9 PT visits approved 09/03/22 - 10/01/22    PT Start Time 1014    PT Stop Time 1056    PT Time Calculation (min) 42 min    Activity Tolerance Patient tolerated treatment well    Behavior During Therapy WFL for tasks assessed/performed             Past Medical History:  Diagnosis Date   Arthritis    Dizziness    Esophageal stricture    Gastroesophageal reflux disease    Hiatal hernia    Hiatal hernia    Varicose veins    Vertigo    chronic with exacerbation   Past Surgical History:  Procedure Laterality Date   BLADDER SUSPENSION     x 2   ENDOVENOUS ABLATION SAPHENOUS VEIN W/ LASER Right 09-05-2013   right greater saphenous vein by Gretta Began MD   ENDOVENOUS ABLATION SAPHENOUS VEIN W/ LASER Left 10-18-2013   endovenous laser ablation left greater saphenous vein by Gretta Began MD   VAGINAL HYSTERECTOMY     Patient Active Problem List   Diagnosis Date Noted   Acute metabolic encephalopathy 08/13/2021   Memory loss 08/13/2021   UTI (urinary tract infection) 08/13/2021   Varicose veins of lower extremities with other complications 05/11/2013   Swelling of limb 05/11/2013   Pure hypercholesterolemia 06/07/2011   ALLERGIC RHINITIS 06/26/2007   GASTROESOPHAGEAL REFLUX DISEASE 06/26/2007   Diaphragmatic hernia 06/26/2007   VERTIGO 06/26/2007   DYSPHAGIA UNSPECIFIED 06/26/2007   ESOPHAGEAL STRICTURE 05/03/2007    PCP: [name], MD  REFERRING PROVIDER: [name], MD   REFERRING DIAG: H81.13 (ICD-10-CM) - Benign paroxysmal vertigo, bilateral   THERAPY DIAG:  Dizziness and giddiness  Unsteadiness on feet  ONSET DATE: 08/26/2022    Rationale for Evaluation and Treatment: Rehabilitation  SUBJECTIVE:   SUBJECTIVE STATEMENT: Spinning dizziness is better, now just has imbalance. Just has a feeling in the back of her neck. Pt's husband asking about Meclizine.   Pt accompanied by: significant other, [name]  PERTINENT HISTORY: PMH: hx of BPPV, chronic vertigo, HLD, memory loss  PAIN:  Are you having pain? No  There were no vitals filed for this visit.   PRECAUTIONS: None  WEIGHT BEARING RESTRICTIONS: No  FALLS: Has patient fallen in last 6 months? No  LIVING ENVIRONMENT: Lives with: lives with their spouse  PLOF: Independent  PATIENT GOALS: Wants to get rid of the dizziness   OBJECTIVE:   DIAGNOSTIC FINDINGS: MRI brain 08/13/21: Brain: No restricted diffusion to suggest acute or subacute infarct. No acute hemorrhage, mass, mass effect, or midline shift. No hydrocephalus or extra-axial collection. Scattered T2 hyperintense signal in the periventricular white matter, likely the sequela of mild chronic small vessel ischemic disease.  COGNITION: Overall cognitive status: Impaired   GAIT: Gait pattern:  Some unsteadiness and step through pattern Distance walked: Clinic distances Assistive device utilized: None Level of assistance: SBA Comments: Pt able to ambulate in and out of session with supervision before and after treatment for BPPV    VESTIBULAR TREATMENT:  DATE: 09/29/22   POSITIONAL TESTING: Right Dix-Hallpike: upbeating, right nystagmus and latency period of about 5-8 seconds, mild nystagmus lasting ~10 seconds, with pt reporting very mild symptoms.    Canalith Repositioning: Epley Right: Number of Reps: 2, Response to Treatment: comment: Symptoms improved and Comment: 2nd PT helping to roll pt into position 3.   Pt not wishing to re-assess after 2 reps.  NMR:   Standing in front of a  busy background on level ground: Horizontal VOR 2 x 30 seconds Vertical VOR 2 x 30 seconds Pt with mild dizziness that subsides quickly On air ex with feet hip width > more narrow BOS 3 x 30 seconds, pt initially more fearful to perform with eyes closed On rockerboard in A/P direction: Weight shifting x10 reps - pt initially more fearful and refusing to perform due to balance and pt immediately grabbing onto // bars without trying to use balance strategies to correct. PT needing to provide max encouragement with pt eventually able to perform. Pt with finger support in bars x5 reps head turns Pt initially adamant about not trying these exercises cause they are too hard, but is willing once she knows she can do it.    FOTO: DFS: 59, DPS: 55.6  PATIENT EDUCATION: Education details: Pt and pt's husband asking about purpose of Meclizine and PT providing education on what this medication does and that pt will likely benefit more from exercise and re-positioning maneuvers to help pt's symptoms. Continued to provide education on what BPPV is and how to treat it. Continued to re-iterate on importance of performing exercises at home and purpose of each and how it will help with dizziness/balance as pt is currently not doing any exercises at home.  Person educated: Patient and Spouse Education method: Explanation, Demonstration, and Verbal cues Education comprehension: verbalized understanding, returned demonstration, and needs further education  HOME EXERCISE PROGRAM: VOR x1 standing x30 seconds Walking with quick turns     Access Code: [email or web address] AD URL: [email or web address] Date: 09/17/2022 Prepared by: Sherlie Ban  Exercises - Brandt-Daroff Vestibular Exercise  - 1-2 x daily - 5 x weekly - 3-4 reps - Standing Balance with Eyes Closed on Foam  - 1-2 x daily - 5 x weekly - 3 sets - 30 hold  GOALS: Goals reviewed with patient? Yes  SHORT TERM GOALS: ALL STGS = LTGS   LONG TERM  GOALS: Target date: 10/01/2022  Pt will be independent with initial HEP for vestibular/balance deficits.   Baseline:  Goal status: INITIAL  2.  Pt will demo resolution of R posterior canal BPPV in order to decr dizziness/unsteadiness for improved functional mobility.   Baseline:  Goal status: INITIAL  3.  Pt will improve FGA to at least a 25/30 in order to demo decr fall risk.  Baseline: 23/30 Goal status: INITIAL  4.  Pt will improve DFS to at least 57 in order to demo improved functional outcomes related to dizziness.  Baseline: 54; 59 on 09/29/22 Goal status: MET  ASSESSMENT:  CLINICAL IMPRESSION: Pt continues with R posterior canalithiasis as noted by R upbeating rotary nystagmus in R DixHallpike. Treated with 2 reps of Epley with 2nd PT needed to help with rolling. Pt reporting feeling better after the 2nd rep, but not wishing to re-assess. Continued to reiterate the importance of performing HEP, esp [place], as pt with stubborn BPPV that is mild or returns. Despite education at each session, pt is not compliant with exercises at home. Asked pt's husband to  help pt with exercises at home. Remainder of session focused on balance strategies on compliant surfaces and VOR with a busy background. Pt initially refusing to perform balance tasks because she was too scared, but did improve once she tried. Pt met LTG in regards to FOTO, indicating improvements with functional outcomes in regards to dizziness. Will continue per POC.    OBJECTIVE IMPAIRMENTS: decreased activity tolerance, decreased balance, decreased cognition, and dizziness.   ACTIVITY LIMITATIONS: bending, transfers, and locomotion level  PARTICIPATION LIMITATIONS: driving, shopping, and community activity  PERSONAL FACTORS: Age, Behavior pattern, Past/current experiences, Time since onset of injury/illness/exacerbation, and 3+ comorbidities: hx of BPPV, chronic vertigo, HLD, memory loss  are also affecting patient's  functional outcome.   REHAB POTENTIAL: Good  CLINICAL DECISION MAKING: Stable/uncomplicated  EVALUATION COMPLEXITY: Low   PLAN:  PT FREQUENCY: 2x/week  PT DURATION: 4 weeks  PLANNED INTERVENTIONS: Therapeutic exercises, Therapeutic activity, Neuromuscular re-education, Balance training, Gait training, Patient/Family education, Self Care, Vestibular training, Canalith repositioning, and Re-evaluation  PLAN FOR NEXT SESSION: Re-assess R posterior BPPV and treat with epley with risers (6" works best) VOR exercises, balance wth EC. D/C??    Drake Leach, PT, DPT  09/29/2022, 12:12 PM

## 2022-10-01 ENCOUNTER — Ambulatory Visit: Payer: Medicare PPO | Admitting: Physical Therapy

## 2022-10-01 ENCOUNTER — Encounter: Payer: Self-pay | Admitting: Physical Therapy

## 2022-10-01 DIAGNOSIS — R42 Dizziness and giddiness: Secondary | ICD-10-CM | POA: Diagnosis not present

## 2022-10-01 DIAGNOSIS — R2681 Unsteadiness on feet: Secondary | ICD-10-CM

## 2022-10-01 NOTE — Therapy (Signed)
OUTPATIENT PHYSICAL THERAPY VESTIBULAR TREATMENT/DISCHARGE SUMMARY     Patient Name: [name] [name] MRN: [id number] DOB:[birth year], 82 y.o., female Today's Date: 10/01/2022  END OF SESSION:  PT End of Session - 10/01/22 1107     Visit Number 8    Number of Visits 9    Date for PT Re-Evaluation 10/03/22    Authorization Type [employer] - 9 PT visits approved 09/03/22 - 10/01/22    PT Start Time 1105    PT Stop Time 1125   full time not used due to [facility]   PT Time Calculation (min) 20 min    Activity Tolerance Patient tolerated treatment well    Behavior During Therapy WFL for tasks assessed/performed             Past Medical History:  Diagnosis Date   Arthritis    Dizziness    Esophageal stricture    Gastroesophageal reflux disease    Hiatal hernia    Hiatal hernia    Varicose veins    Vertigo    chronic with exacerbation   Past Surgical History:  Procedure Laterality Date   BLADDER SUSPENSION     x 2   ENDOVENOUS ABLATION SAPHENOUS VEIN W/ LASER Right 09-05-2013   right greater saphenous vein by Gretta Began MD   ENDOVENOUS ABLATION SAPHENOUS VEIN W/ LASER Left 10-18-2013   endovenous laser ablation left greater saphenous vein by Gretta Began MD   VAGINAL HYSTERECTOMY     Patient Active Problem List   Diagnosis Date Noted   Acute metabolic encephalopathy 08/13/2021   Memory loss 08/13/2021   UTI (urinary tract infection) 08/13/2021   Varicose veins of lower extremities with other complications 05/11/2013   Swelling of limb 05/11/2013   Pure hypercholesterolemia 06/07/2011   ALLERGIC RHINITIS 06/26/2007   GASTROESOPHAGEAL REFLUX DISEASE 06/26/2007   Diaphragmatic hernia 06/26/2007   VERTIGO 06/26/2007   DYSPHAGIA UNSPECIFIED 06/26/2007   ESOPHAGEAL STRICTURE 05/03/2007    PCP: [name], MD  REFERRING PROVIDER: [name], MD   REFERRING DIAG: H81.13 (ICD-10-CM) - Benign paroxysmal vertigo, bilateral   THERAPY DIAG:  Dizziness and  giddiness  Unsteadiness on feet  ONSET DATE: 08/26/2022   Rationale for Evaluation and Treatment: Rehabilitation  SUBJECTIVE:   SUBJECTIVE STATEMENT: No changes since she was last here. Feels like the dizziness is getting better. Wants to graduate today.   Pt accompanied by: significant other, [name]  PERTINENT HISTORY: PMH: hx of BPPV, chronic vertigo, HLD, memory loss  PAIN:  Are you having pain? No  There were no vitals filed for this visit.   PRECAUTIONS: None  WEIGHT BEARING RESTRICTIONS: No  FALLS: Has patient fallen in last 6 months? No  LIVING ENVIRONMENT: Lives with: lives with their spouse  PLOF: Independent  PATIENT GOALS: Wants to get rid of the dizziness   OBJECTIVE:   DIAGNOSTIC FINDINGS: MRI brain 08/13/21: Brain: No restricted diffusion to suggest acute or subacute infarct. No acute hemorrhage, mass, mass effect, or midline shift. No hydrocephalus or extra-axial collection. Scattered T2 hyperintense signal in the periventricular white matter, likely the sequela of mild chronic small vessel ischemic disease.  COGNITION: Overall cognitive status: Impaired   GAIT: Gait pattern:  Some unsteadiness and step through pattern Distance walked: Clinic distances Assistive device utilized: None Level of assistance: SBA Comments: Pt able to ambulate in and out of session with supervision before and after treatment for BPPV    VESTIBULAR TREATMENT:  DATE: 10/01/22   Therapeutic Activity:  POSITIONAL TESTING: Right Dix-Hallpike: no nystagmus and and no dizziness. Assessed 2 reps.     [facility] PT Assessment - 10/01/22 1108       Functional Gait  Assessment   Gait assessed  Yes    Gait Level Surface Walks 20 ft in less than 7 sec but greater than 5.5 sec, uses assistive device, slower speed, mild gait deviations, or deviates 6-10 in outside of the 12 in  walkway width.    Change in Gait Speed Able to smoothly change walking speed without loss of balance or gait deviation. Deviate no more than 6 in outside of the 12 in walkway width.    Gait with Horizontal Head Turns Performs head turns smoothly with no change in gait. Deviates no more than 6 in outside 12 in walkway width    Gait with Vertical Head Turns Performs head turns with no change in gait. Deviates no more than 6 in outside 12 in walkway width.    Gait and Pivot Turn Pivot turns safely within 3 sec and stops quickly with no loss of balance.    Step Over Obstacle Is able to step over 2 stacked shoe boxes taped together (9 in total height) without changing gait speed. No evidence of imbalance.    Gait with Narrow Base of Support Ambulates 4-7 steps.   4 steps   Gait with Eyes Closed Walks 20 ft, uses assistive device, slower speed, mild gait deviations, deviates 6-10 in outside 12 in walkway width. Ambulates 20 ft in less than 9 sec but greater than 7 sec.   8.9 seconds   Ambulating Backwards Walks 20 ft, uses assistive device, slower speed, mild gait deviations, deviates 6-10 in outside 12 in walkway width.    Steps Alternating feet, no rail.    Total Score 25    FGA comment: 25/30 = Low Fall Risk             PATIENT EDUCATION: Education details: Discussed results of LTGs and resolution of R posterior canal BPPV and D/C from PT due to progress with pt in agreement with plan, discussed importance of performing HEP going forwards at home and it dizziness worsens in the future, then pt can get a new referral to return to PT  Person educated: Patient and Spouse Education method: Explanation and Demonstration Education comprehension: verbalized understanding, returned demonstration, and needs further education  HOME EXERCISE PROGRAM: VOR x1 standing x30 seconds Walking with quick turns     Access Code: [email or web address] AD URL: [email or web address] Date: 09/17/2022 Prepared by:  Sherlie Ban  Exercises - Brandt-Daroff Vestibular Exercise  - 1-2 x daily - 5 x weekly - 3-4 reps - Standing Balance with Eyes Closed on Foam  - 1-2 x daily - 5 x weekly - 3 sets - 30 hold   PHYSICAL THERAPY DISCHARGE SUMMARY  Visits from Start of Care: 8  Current functional level related to goals / functional outcomes: See LTGs/Clinical Assessment Statement   Remaining deficits: Impaired high level balance    Education / Equipment: HEP, BPPV education    Patient agrees to discharge. Patient goals were met. Patient is being discharged due to meeting the stated rehab goals. And pt pleased with her progress with therapy.    GOALS: Goals reviewed with patient? Yes  SHORT TERM GOALS: ALL STGS = LTGS   LONG TERM GOALS: Target date: 10/01/2022  Pt will be independent with initial HEP for vestibular/balance deficits.   Baseline: pt  not performing HEP consistently at home despite education  Goal status: NOT MET  2.  Pt will demo resolution of R posterior canal BPPV in order to decr dizziness/unsteadiness for improved functional mobility.   Baseline: pt negative with R posterior canal BPPV on 10/01/22 Goal status: MET  3.  Pt will improve FGA to at least a 25/30 in order to demo decr fall risk.  Baseline: 23/30; 25/30 on 10/01/22 Goal status: MET  4.  Pt will improve DFS to at least 57 in order to demo improved functional outcomes related to dizziness.  Baseline: 54; 59 on 09/29/22 Goal status: MET  ASSESSMENT:  CLINICAL IMPRESSION: Assessed remainder of LTGs today with pt meeting 3 out of 4 LTGs. Pt improved FGA from a 23/30 > 25/30, indicating pt is now at a low fall risk. Pt demonstrating negative positional testing with R posterior canal BPPV with no dizziness/nystagmus. Pt also met FOTO goal, indicating improved functional status in regards to FOTO. Pt is pleased with her progress and reports less dizziness than before. Pt in agreement to D/C at this time. Educated on  importance of performing exercises at home and pt can come back to therapy in the future with a new referral if dizziness returns.  Will continue per POC.    OBJECTIVE IMPAIRMENTS: decreased activity tolerance, decreased balance, decreased cognition, and dizziness.   ACTIVITY LIMITATIONS: bending, transfers, and locomotion level  PARTICIPATION LIMITATIONS: driving, shopping, and community activity  PERSONAL FACTORS: Age, Behavior pattern, Past/current experiences, Time since onset of injury/illness/exacerbation, and 3+ comorbidities: hx of BPPV, chronic vertigo, HLD, memory loss  are also affecting patient's functional outcome.   REHAB POTENTIAL: Good  CLINICAL DECISION MAKING: Stable/uncomplicated  EVALUATION COMPLEXITY: Low   PLAN:  PT FREQUENCY: 2x/week  PT DURATION: 4 weeks  PLANNED INTERVENTIONS: Therapeutic exercises, Therapeutic activity, Neuromuscular re-education, Balance training, Gait training, Patient/Family education, Self Care, Vestibular training, Canalith repositioning, and Re-evaluation  PLAN FOR NEXT SESSION: D/C from PT   Drake Leach, PT, DPT  10/01/2022, 11:35 AM

## 2022-12-20 DIAGNOSIS — F0390 Unspecified dementia without behavioral disturbance: Secondary | ICD-10-CM | POA: Diagnosis not present

## 2022-12-20 DIAGNOSIS — R413 Other amnesia: Secondary | ICD-10-CM | POA: Diagnosis not present

## 2023-03-02 DIAGNOSIS — Z23 Encounter for immunization: Secondary | ICD-10-CM | POA: Diagnosis not present

## 2023-03-17 ENCOUNTER — Other Ambulatory Visit (HOSPITAL_BASED_OUTPATIENT_CLINIC_OR_DEPARTMENT_OTHER): Payer: Self-pay

## 2023-03-17 MED ORDER — COMIRNATY 30 MCG/0.3ML IM SUSY
0.3000 mL | PREFILLED_SYRINGE | Freq: Once | INTRAMUSCULAR | 0 refills | Status: AC
Start: 2023-03-17 — End: 2023-03-18
  Filled 2023-03-17: qty 0.3, 1d supply, fill #0

## 2023-04-19 DIAGNOSIS — Z85828 Personal history of other malignant neoplasm of skin: Secondary | ICD-10-CM | POA: Diagnosis not present

## 2023-04-19 DIAGNOSIS — C44329 Squamous cell carcinoma of skin of other parts of face: Secondary | ICD-10-CM | POA: Diagnosis not present

## 2023-04-19 DIAGNOSIS — L570 Actinic keratosis: Secondary | ICD-10-CM | POA: Diagnosis not present

## 2023-04-19 DIAGNOSIS — C44320 Squamous cell carcinoma of skin of unspecified parts of face: Secondary | ICD-10-CM | POA: Diagnosis not present

## 2023-04-19 DIAGNOSIS — D2239 Melanocytic nevi of other parts of face: Secondary | ICD-10-CM | POA: Diagnosis not present

## 2023-04-19 DIAGNOSIS — D045 Carcinoma in situ of skin of trunk: Secondary | ICD-10-CM | POA: Diagnosis not present

## 2023-05-23 DIAGNOSIS — C44329 Squamous cell carcinoma of skin of other parts of face: Secondary | ICD-10-CM | POA: Diagnosis not present

## 2023-05-23 DIAGNOSIS — Z85828 Personal history of other malignant neoplasm of skin: Secondary | ICD-10-CM | POA: Diagnosis not present

## 2023-08-15 DIAGNOSIS — L820 Inflamed seborrheic keratosis: Secondary | ICD-10-CM | POA: Diagnosis not present

## 2023-08-15 DIAGNOSIS — D485 Neoplasm of uncertain behavior of skin: Secondary | ICD-10-CM | POA: Diagnosis not present

## 2023-08-15 DIAGNOSIS — Z85828 Personal history of other malignant neoplasm of skin: Secondary | ICD-10-CM | POA: Diagnosis not present

## 2023-08-15 DIAGNOSIS — L821 Other seborrheic keratosis: Secondary | ICD-10-CM | POA: Diagnosis not present

## 2023-09-07 DIAGNOSIS — K219 Gastro-esophageal reflux disease without esophagitis: Secondary | ICD-10-CM | POA: Diagnosis not present

## 2023-09-07 DIAGNOSIS — E7800 Pure hypercholesterolemia, unspecified: Secondary | ICD-10-CM | POA: Diagnosis not present

## 2023-09-07 DIAGNOSIS — R413 Other amnesia: Secondary | ICD-10-CM | POA: Diagnosis not present

## 2023-09-07 DIAGNOSIS — Z0000 Encounter for general adult medical examination without abnormal findings: Secondary | ICD-10-CM | POA: Diagnosis not present

## 2023-09-07 DIAGNOSIS — R197 Diarrhea, unspecified: Secondary | ICD-10-CM | POA: Diagnosis not present

## 2023-09-27 ENCOUNTER — Ambulatory Visit: Admitting: Podiatry

## 2023-09-27 ENCOUNTER — Encounter: Payer: Self-pay | Admitting: Podiatry

## 2023-09-27 VITALS — Ht 63.0 in | Wt 143.0 lb

## 2023-09-27 DIAGNOSIS — M2041 Other hammer toe(s) (acquired), right foot: Secondary | ICD-10-CM | POA: Diagnosis not present

## 2023-09-27 NOTE — Patient Instructions (Addendum)
 VISIT SUMMARY:  Today, you were seen for a red bump on your toe that has been present for a long time. You also mentioned another bump starting next to the existing one. We discussed your current symptoms and potential treatment options.  YOUR PLAN:  -HAMMER TOE: Hammer toe is a condition where there is a deformity in the toe causing it to bend or curl downward. You have a semi-rigid deformity in your right second and third toes. Currently, there is no pain or functional limitation. We discussed the potential for progression and surgical intervention if symptoms worsen. For now, you will use silicone pads to cushion the affected toes and monitor the condition. If symptoms worsen, consider x-rays and surgical intervention.  -ARTHRITIS OF TOE JOINT: Arthritis is a condition that causes swelling and tenderness in your joints. The arthritis in your toe joint is likely contributing to the swelling. Currently, there is no pain or functional limitation. It is important to wear comfortable shoes with adequate room to prevent rubbing on the joint, which could cause pain or sores.  INSTRUCTIONS:  Please follow up as needed if your symptoms worsen. Consider x-rays and surgical intervention if you experience pain or functional limitation in the future.   More silicone pads can be purchased from:  [email or web address]

## 2023-09-27 NOTE — Progress Notes (Signed)
  Subjective:  Patient ID: [name] [name], female    DOB: August 25, 1940,  MRN: [id number]  Chief Complaint  Patient presents with   Toe Pain    Pt is here due to left foot 2nd toe issue, pt has a knot on 2nd left toe and would like it to be checked out, states it has been there for a while, does not hurt.    Discussed the use of AI scribe software for clinical note transcription with the patient, who gave verbal consent to proceed.  History of Present Illness [name] [name] is an 83 year old female who presents with a red bump on her toe.  She has a red bump on her toe that has been present for a long time. It is not painful and does not interfere with her daily activities. Occasionally, it bleeds, and she sometimes covers it with a Band-Aid.  There is another bump starting next to the existing one, described as 'just a bump' and has never been sore.  She regularly wears boots, which are comfortable and not tight, and often goes barefoot. She has not experienced any pain from the bump while wearing her boots.  She has arthritis in different places, which may contribute to the swelling of the toe joint. No pain or soreness associated with the bump on her toe.      Objective:    Physical Exam VASCULAR: DP and PT pulse palpable. Foot is warm and well-perfused. Capillary fill time is brisk. DERMATOLOGIC: Normal skin turgor, texture, and temperature. Dorsal PIPJ erythema present, no ulceration or mucoid cyst development. No open lesions or rashes. NEUROLOGIC: Normal sensation to light touch and pressure. No paresthesias on examination. ORTHOPEDIC: Smooth pain-free range of motion of all examined joints. Right second and third toe hammer toe contracture, semi-rigid. No ecchymosis or bruising. No gross deformity. Non-tender to palpation.   No images are attached to the encounter.    Results    Assessment:   1. Hammertoe of right foot      Plan:  Patient was evaluated and treated and  all questions answered.  Assessment and Plan Assessment & Plan Hammer toe Hammer toe contracture on the right second and third toes with semi-rigid deformity. Dorsal PIPJ erythema present without ulceration or mucoid cyst development. Non-tender on palpation. No current pain or functional limitation. Discussed potential for progression and surgical intervention if symptoms worsen. She prefers to monitor condition and use conservative measures at this time. - Dispense silicone pads for offloading and cushioning of the affected toes. - Instruct on purchasing additional silicone pads from [employer]. - Advise follow-up PRN if symptoms worsen. - Consider x-rays and surgical intervention if pain or functional limitation develops.  Arthritis of toe joint Chronic arthritis likely contributing to swelling of the toe joint. No current pain or functional limitation. Potential for exacerbation if shoes are tight and rub against the joint. She reports wearing comfortable boots with adequate room, minimizing risk of irritation. - Advise wearing comfortable shoes with adequate room to prevent rubbing on the joint. - Educate on the potential for pain or sores if shoes are tight.      Return if symptoms worsen or fail to improve.

## 2023-12-26 ENCOUNTER — Ambulatory Visit: Admitting: Podiatry

## 2023-12-28 ENCOUNTER — Encounter: Payer: Self-pay | Admitting: Podiatry

## 2023-12-28 ENCOUNTER — Ambulatory Visit: Admitting: Podiatry

## 2023-12-28 DIAGNOSIS — L84 Corns and callosities: Secondary | ICD-10-CM

## 2023-12-28 DIAGNOSIS — M21612 Bunion of left foot: Secondary | ICD-10-CM | POA: Diagnosis not present

## 2023-12-28 NOTE — Progress Notes (Signed)
 Subjective:   Patient ID: [name] [name], female   DOB: 83 y.o.   MRN: [id number]   HPI Patient is noted to have severe keratotic lesions sub the left foot 1st and 5th metatarsal that are painful second digit with elevated second toe and structural bunion deformity noted   ROS      Objective:  Physical Exam  Neurovascular status found to be intact with patient found to have a large eminence first metatarsal head left to get painful at times elevated second toe with keratotic lesion and chronic lesions plantar that are painful to walk on     Assessment:  Chronic lesion formation bilateral probable callus or possible porokeratotic type tissue formation with structural bunion hammertoe     Plan:  H&P reviewed and sharp sterile debridement of underlying lesions with no iatrogenic bleeding that can be repeated as needed to try to keep symptoms under control

## 2023-12-29 ENCOUNTER — Ambulatory Visit: Admitting: Podiatry

## 2024-01-10 ENCOUNTER — Ambulatory Visit: Admitting: Podiatry

## 2024-01-11 DIAGNOSIS — Z01419 Encounter for gynecological examination (general) (routine) without abnormal findings: Secondary | ICD-10-CM | POA: Diagnosis not present

## 2024-01-11 DIAGNOSIS — R3915 Urgency of urination: Secondary | ICD-10-CM | POA: Diagnosis not present

## 2024-01-11 DIAGNOSIS — N8111 Cystocele, midline: Secondary | ICD-10-CM | POA: Diagnosis not present

## 2024-02-08 IMAGING — MR MR HEAD W/O CM
9 of 10 series · 36 of 48 positions shown · non-contrast
Comparison: 05/02/2020 MRI head, correlation is also made with
08/13/2021 CT head and CTA head neck

CLINICAL DATA: Left-sided neglect, stroke suspected

EXAM:
MRI HEAD WITHOUT CONTRAST
TECHNIQUE: Multiplanar, multiecho pulse sequences of the brain and surrounding
structures were obtained without intravenous contrast.

[Series 3: DWI · axial · 3.0mm · 1.09mm/px · z∈[-61,+98]mm · 8 of 108 slices shown (1 of 4)]
[im 1/108]
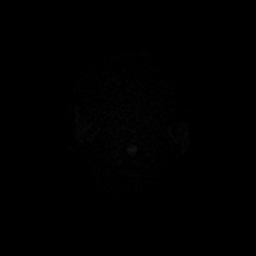
[im 12/108]
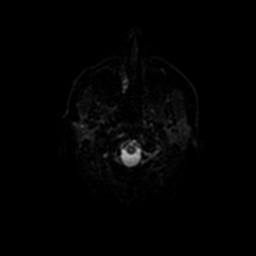
[im 36/108]
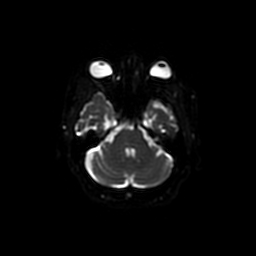
[im 48/108]
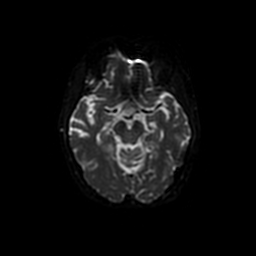
[im 60/108]
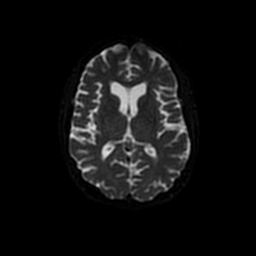
[im 72/108]
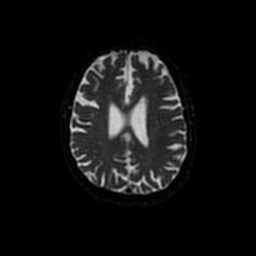
[im 96/108]
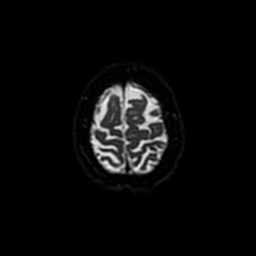
[im 108/108]
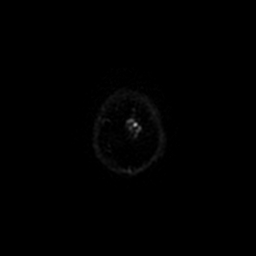

[Series 4: DWI · coronal · 5.0mm · 1.09mm/px · 7 of 74 slices shown (2 of 4)]
[im 1/74]
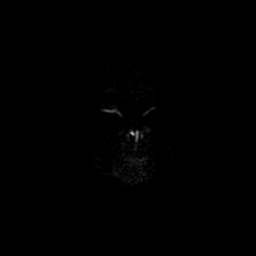
[im 13/74]
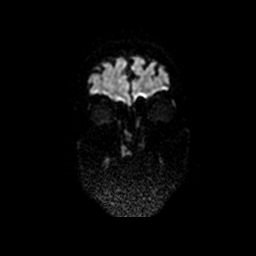
[im 25/74]
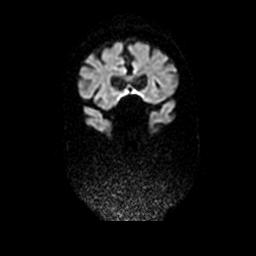
[im 37/74]
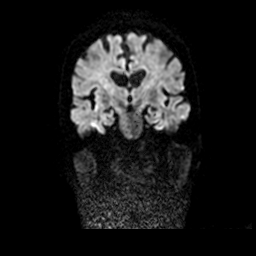
[im 49/74]
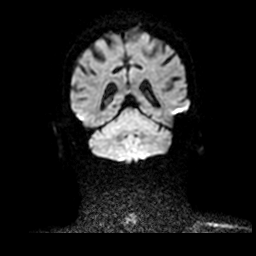
[im 61/74]
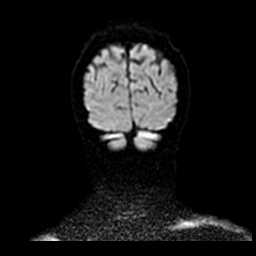
[im 74/74]
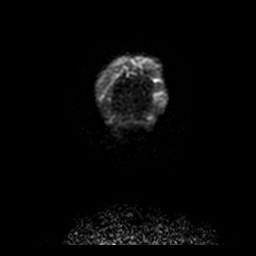

[Series 5: T1 · sagittal · 5.0mm · 0.47mm/px · 2 of 23 slices shown (1 of 2)]
[im 1/23]
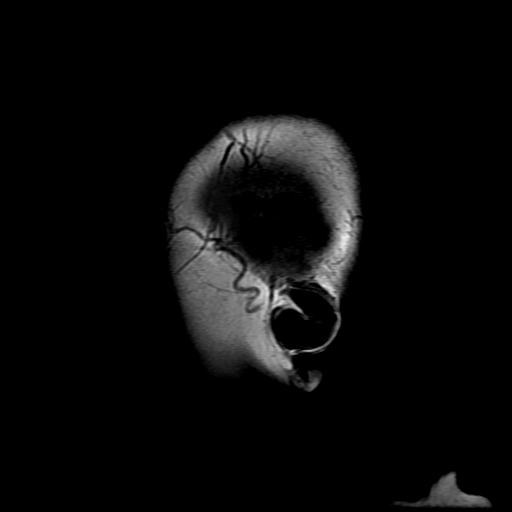
[im 23/23]
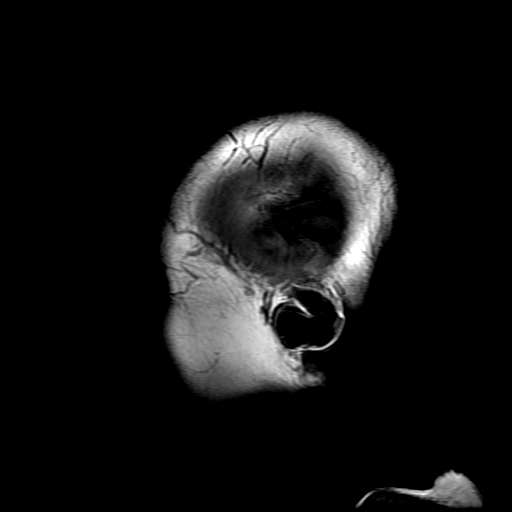

[Series 6: T2 · axial · 5.0mm · 0.43mm/px · z∈[-71,+77]mm · 3 of 26 slices shown (1 of 2)]
[im 1/26]
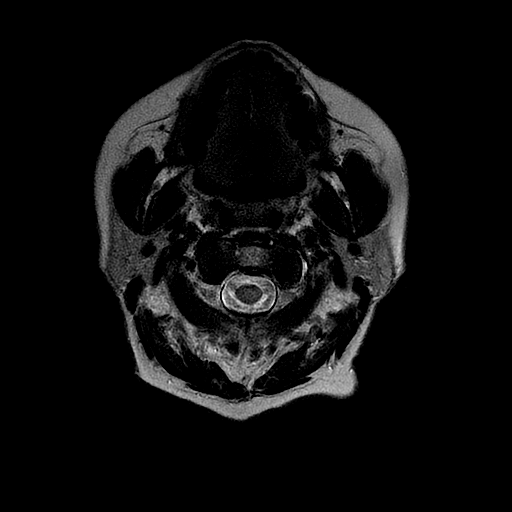
[im 13/26]
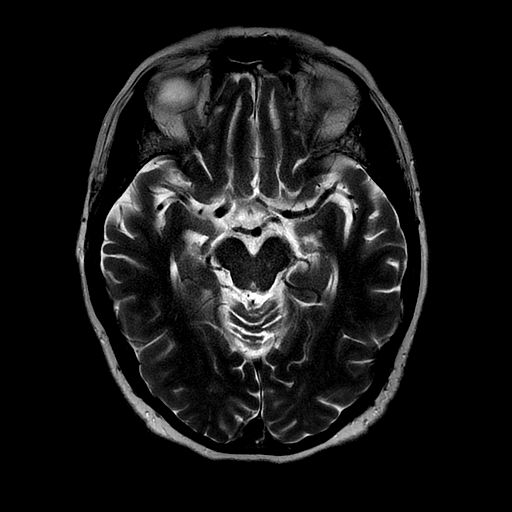
[im 26/26]
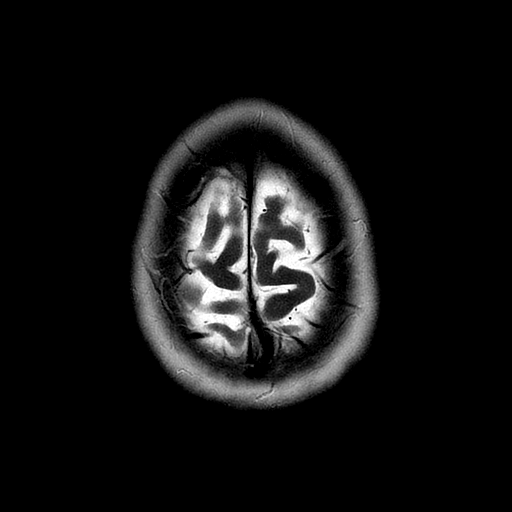

[Series 7: FLAIR · axial · 3.0mm · 0.43mm/px · z∈[-71,+77]mm · 3 of 26 slices shown]
[im 1/26]
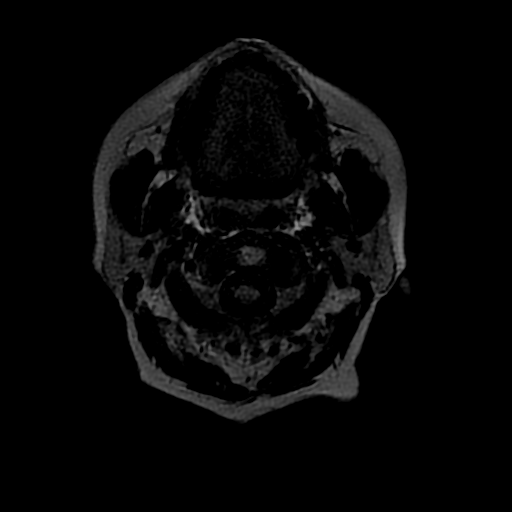
[im 13/26]
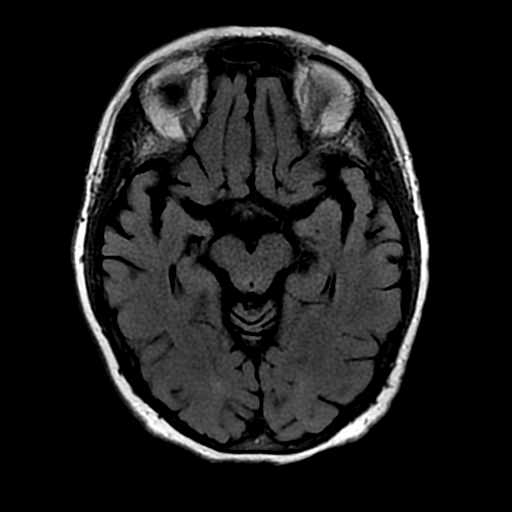
[im 26/26]
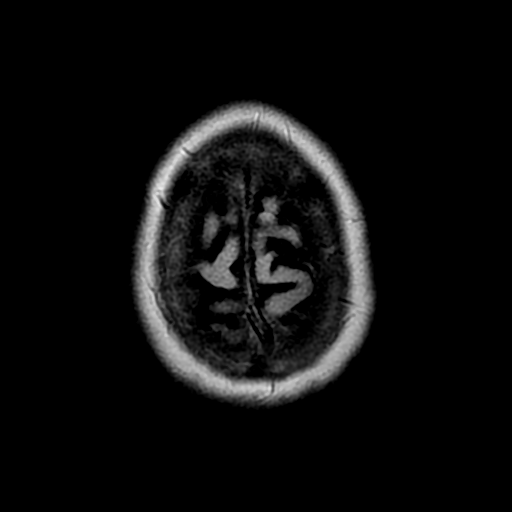

[Series 9: T1 · axial · 3.0mm · 0.43mm/px · z∈[-73,-56]mm · 2 of 104 slices shown (2 of 2)]
[im 1/104]
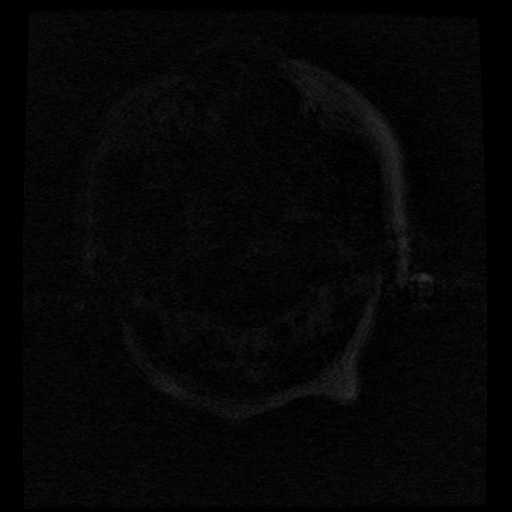
[im 12/104]
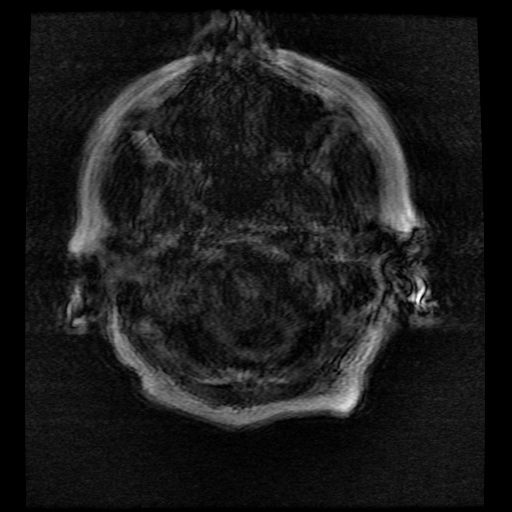

[Series 10: T2 · coronal · 5.0mm · 0.43mm/px · 2 of 24 slices shown (2 of 2)]
[im 1/24]
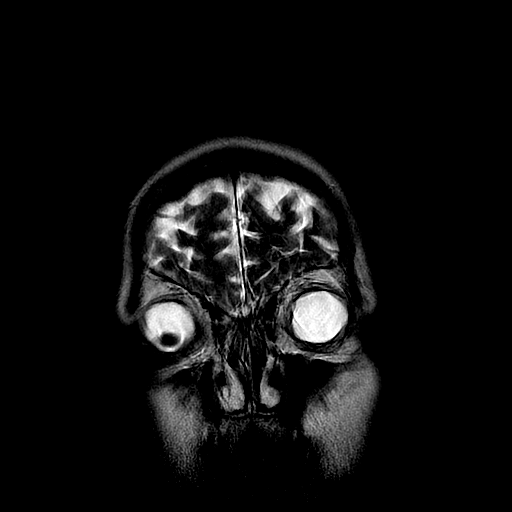
[im 24/24]
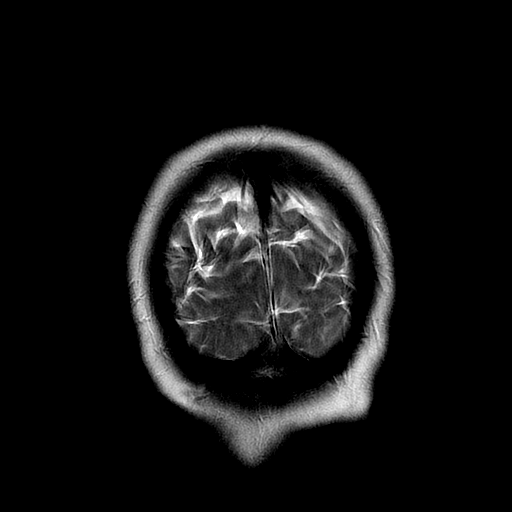

[Series 300: DWI · axial · 3.0mm · 1.09mm/px · z∈[-61,+98]mm · 5 of 54 slices shown (3 of 4)]
[im 1/54]
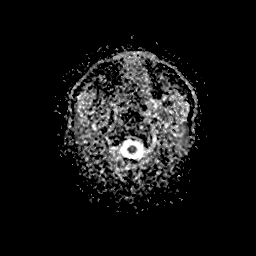
[im 14/54]
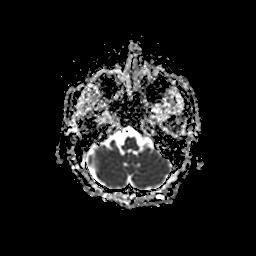
[im 27/54]
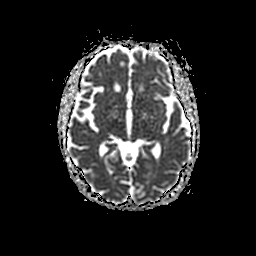
[im 40/54]
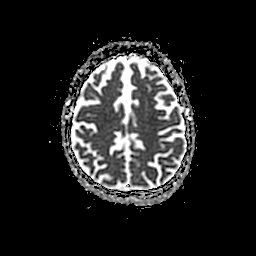
[im 54/54]
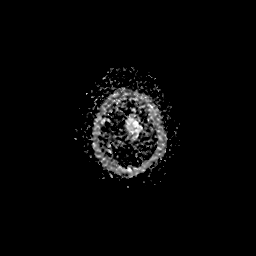

[Series 400: DWI · coronal · 5.0mm · 1.09mm/px · 4 of 37 slices shown (4 of 4)]
[im 1/37]
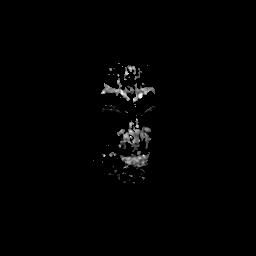
[im 13/37]
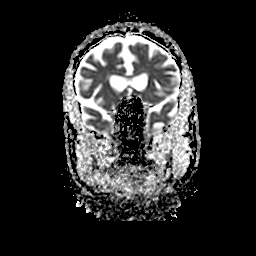
[im 25/37]
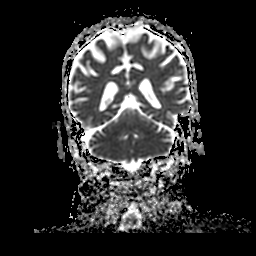
[im 37/37]
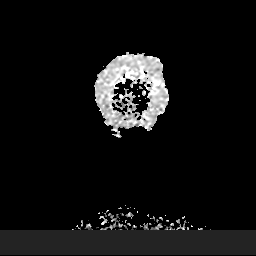

[36 of 48 positions shown; findings below may reference images not displayed]

FINDINGS: Evaluation is somewhat limited by motion artifact.

Brain: No restricted diffusion to suggest acute or subacute infarct.
No acute hemorrhage, mass, mass effect, or midline shift. No
hydrocephalus or extra-axial collection. Scattered T2 hyperintense
signal in the periventricular white matter, likely the sequela of
mild chronic small vessel ischemic disease.

Vascular: Normal flow voids.

Skull and upper cervical spine: Normal marrow signal.

Sinuses/Orbits: Negative.

Other: The mastoids are well aerated.
IMPRESSION: Evaluation is somewhat limited by motion artifact. Within this
limitation, no acute intracranial process.

## 2024-03-26 DIAGNOSIS — R413 Other amnesia: Secondary | ICD-10-CM | POA: Diagnosis not present

## 2024-03-26 DIAGNOSIS — E7800 Pure hypercholesterolemia, unspecified: Secondary | ICD-10-CM | POA: Diagnosis not present

## 2024-03-26 DIAGNOSIS — R42 Dizziness and giddiness: Secondary | ICD-10-CM | POA: Diagnosis not present

## 2024-03-26 DIAGNOSIS — R197 Diarrhea, unspecified: Secondary | ICD-10-CM | POA: Diagnosis not present

## 2024-03-26 DIAGNOSIS — Z23 Encounter for immunization: Secondary | ICD-10-CM | POA: Diagnosis not present

## 2024-04-11 ENCOUNTER — Ambulatory Visit: Admitting: Podiatry

## 2024-04-11 ENCOUNTER — Encounter: Payer: Self-pay | Admitting: Podiatry

## 2024-04-11 DIAGNOSIS — L84 Corns and callosities: Secondary | ICD-10-CM | POA: Diagnosis not present

## 2024-04-11 DIAGNOSIS — M21612 Bunion of left foot: Secondary | ICD-10-CM

## 2024-04-12 NOTE — Progress Notes (Signed)
 Subjective:   Patient ID: [name] [name], female   DOB: 83 y.o.   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  He is in agreement we will try to use accommodating shoes which we discussed and will be seen back as needed for either condition

## 2024-05-09 ENCOUNTER — Ambulatory Visit: Admitting: Physical Therapy

## 2024-05-14 ENCOUNTER — Ambulatory Visit: Admitting: Physical Therapy

## 2024-05-14 ENCOUNTER — Encounter: Payer: Self-pay | Admitting: Physical Therapy

## 2024-05-14 VITALS — BP 127/68 | HR 68

## 2024-05-14 DIAGNOSIS — H8111 Benign paroxysmal vertigo, right ear: Secondary | ICD-10-CM | POA: Diagnosis present

## 2024-05-14 DIAGNOSIS — R42 Dizziness and giddiness: Secondary | ICD-10-CM | POA: Diagnosis present

## 2024-05-14 NOTE — Progress Notes (Signed)
 OUTPATIENT PHYSICAL THERAPY VESTIBULAR EVALUATION     Patient Name: [name] [name] MRN: [id number] DOB:Sep 09, 1940, 83 y.o., female Today's Date: 05/15/2024  END OF SESSION:  PT End of Session - 05/15/24 1014     Visit Number 1    Number of Visits 5    Date for Recertification  06/14/24    Authorization Type [employer]IC[employer]    PT Start Time 1446    PT Stop Time 1528    PT Time Calculation (min) 42 min    Activity Tolerance Patient tolerated treatment well    Behavior During Therapy WFL for tasks assessed/performed          Past Medical History:  Diagnosis Date   Arthritis    Dizziness    Esophageal stricture    Gastroesophageal reflux disease    Hiatal hernia    Hiatal hernia    Varicose veins    Vertigo    chronic with exacerbation   Past Surgical History:  Procedure Laterality Date   BLADDER SUSPENSION     x 2   ENDOVENOUS ABLATION SAPHENOUS VEIN W/ LASER Right 09-05-2013   right greater saphenous vein by Krystal Doing MD   ENDOVENOUS ABLATION SAPHENOUS VEIN W/ LASER Left 10-18-2013   endovenous laser ablation left greater saphenous vein by Krystal Doing MD   VAGINAL HYSTERECTOMY     Patient Active Problem List   Diagnosis Date Noted   Acute metabolic encephalopathy 08/13/2021   Memory loss 08/13/2021   UTI (urinary tract infection) 08/13/2021   Varicose veins of bilateral lower extremities with other complications 05/11/2013   Swelling of limb 05/11/2013   Pure hypercholesterolemia 06/07/2011   Allergic rhinitis 06/26/2007   GASTROESOPHAGEAL REFLUX DISEASE 06/26/2007   Diaphragmatic hernia 06/26/2007   VERTIGO 06/26/2007   Dysphagia 06/26/2007   ESOPHAGEAL STRICTURE 05/03/2007    PCP: Arloa Elsie SAUNDERS, MD REFERRING PROVIDER: Arloa Elsie SAUNDERS, MD  REFERRING DIAG: R42 (ICD-10-CM) - Dizziness and giddiness  THERAPY DIAG:  BPPV (benign paroxysmal positional vertigo), right  Dizziness and giddiness  ONSET DATE: 04/24/2024  Rationale for Evaluation  and Treatment: Rehabilitation  SUBJECTIVE:   SUBJECTIVE STATEMENT: Pt reports the dizziness has come back a couple times. It has not been as bad as it has been before. Not having any dizziness today. Reports it has been about a month since she has had any dizziness. Denies lightheadedness. No falls or almost falls.  Pt accompanied by: [name]  PERTINENT HISTORY: PMH: hx of BPPV, chronic vertigo, HLD, memory loss   PAIN:  Are you having pain? No   Vitals:   05/14/24 1502  BP: 127/68  Pulse: 68     PRECAUTIONS: Other: hx of BPPV  FALLS: Has patient fallen in last 6 months? No  LIVING ENVIRONMENT: Lives with: lives with their spouse Lives in: House/apartment Stairs: Yes: External: 2 steps and 1 step from the garage steps; none Has following equipment at home: None  PLOF: Independent, enjoys reading  PATIENT GOALS: My husband wanted me to come Husband just wants pt to get checked out  OBJECTIVE:  Note: Objective measures were completed at Evaluation unless otherwise noted.  DIAGNOSTIC FINDINGS: No recent imaging   COGNITION: Overall cognitive status: Within functional limits for tasks assessed    POSTURE:  rounded shoulders  Cervical ROM:   Limited into cervical rotation and extension   GAIT: Gait pattern: WFL, slightly widened BOS Distance walked: Clinic distances  Assistive device utilized: None Level of assistance: Complete Independence Comments: Ambulates  in and out of clinic independently with no issues    VESTIBULAR ASSESSMENT:  GENERAL OBSERVATION: Ambulates in independently.    SYMPTOM BEHAVIOR:  Subjective history: See above, husband provides a lot of subjective.   Non-Vestibular symptoms: N/A  Type of dizziness: imbalance, has to touch walls   Frequency: last episode was about a month ago  Duration: a minute or 2  Aggravating factors: being in a car that's driving too fast  Relieving factors: touching sometimes  Progression of  symptoms: better  OCULOMOTOR EXAM:  Ocular Alignment: normal  Ocular ROM: No Limitations  Spontaneous Nystagmus: absent  Gaze-Induced Nystagmus: absent  Smooth Pursuits: intact  Saccades: intact   VESTIBULAR - OCULAR REFLEX:   Slow VOR: Normal  VOR Cancellation: Normal  Head-Impulse Test: HIT Right: positive HIT Left: positive Pt with no symptoms and dizziness     POSITIONAL TESTING: Right Dix-Hallpike: upbeating, right nystagmus and rotary nystagmus, lasting approx 15 seconds (pt initially closing her eyes), pt does not lay back all the way into extension due to ROM limitations and dizziness                                                                                                                               TREATMENT DATE: 05/15/24   Canalith Repositioning:  Epley Right: Number of Reps: 1, Response to Treatment: comment: did not get to re-assess at end of session due to time constraints, but pt tolerated well, and Comment: performed with 4 risers under mat table due to limited cervical extension ROM  Provided BPPV education and reoccurrence and treatment with Epley maneuver   PATIENT EDUCATION: Education details: Clinical findings, return of R posterior canal BPPV, POC  Person educated: Patient and Spouse Education method: Explanation and Demonstration Education comprehension: verbalized understanding and needs further education  HOME EXERCISE PROGRAM: Will review from previous POC as appropriate   GOALS: Goals reviewed with patient? Yes  SHORT TERM GOALS: ALL STGS = LTGS  LONG TERM GOALS: Target date: 06/12/2024  Pt will demo negative R posterior canal BPPV in order to demo decr dizziness/imbalance Baseline: (+) R posterior canalithiasis  Goal status: INITIAL  2.  Further balance to be assessed as needed and goal written.  Baseline:  Goal status: INITIAL    ASSESSMENT:  CLINICAL IMPRESSION: Patient is a 83 year old female well known to this PT  referred to Neuro OPPT for dizziness.   Pt's PMH is significant for: hx of BPPV, chronic vertigo, HLD, memory loss. The following deficits were present during the exam: positive bilateral HIT (pt with no symptoms and unclear if fully following directions with able to keep eyes on therapist's nose), and R upbeating rotary nystagmus in R DixHallpike lasting approx 15 seconds indicating R posterior canal BPPV. Pt has not reported any dizziness/vertigo prior to eval, but did report some moments of feeling unsteady. Treated with x1 rep of the Epley maneuver with use of 4 risers due  to limited cervical extension. Pt tolerated well, did not get to re-assess due to time constraints.  Pt would benefit from skilled PT to address these impairments and functional limitations to maximize functional mobility independence and decr dizziness/imbalance.    OBJECTIVE IMPAIRMENTS: decreased balance, dizziness, and postural dysfunction.   ACTIVITY LIMITATIONS: bending, bed mobility, and locomotion level  PARTICIPATION LIMITATIONS: community activity  PERSONAL FACTORS: Age, Behavior pattern, Past/current experiences, Time since onset of injury/illness/exacerbation, and 3+ comorbidities: hx of BPPV, chronic vertigo, HLD, memory loss  are also affecting patient's functional outcome.   REHAB POTENTIAL: Good  CLINICAL DECISION MAKING: Stable/uncomplicated  EVALUATION COMPLEXITY: Low   PLAN:  PT FREQUENCY: 1-2x/week  PT DURATION: 4 weeks  PLANNED INTERVENTIONS: 97164- PT Re-evaluation, 97110-Therapeutic exercises, 97530- Therapeutic activity, 97112- Neuromuscular re-education, 97535- Self Care, [id number]- Manual therapy, [phone]- Canalith repositioning, Patient/Family education, Balance training, and Vestibular training  PLAN FOR NEXT SESSION: re-check R posterior canal BPPV and treat, perform with 4 risers under mat table to due limited extension ROM    Lindwood Mogel N Chenel Wernli, PT, DPT 05/15/2024, 10:15 AM

## 2024-06-04 ENCOUNTER — Encounter: Payer: Self-pay | Admitting: Physical Therapy

## 2024-06-04 ENCOUNTER — Ambulatory Visit: Attending: Family Medicine | Admitting: Physical Therapy

## 2024-06-04 DIAGNOSIS — R42 Dizziness and giddiness: Secondary | ICD-10-CM | POA: Insufficient documentation

## 2024-06-04 DIAGNOSIS — R2681 Unsteadiness on feet: Secondary | ICD-10-CM | POA: Insufficient documentation

## 2024-06-04 DIAGNOSIS — H8111 Benign paroxysmal vertigo, right ear: Secondary | ICD-10-CM | POA: Insufficient documentation

## 2024-06-04 NOTE — Progress Notes (Signed)
 " OUTPATIENT PHYSICAL THERAPY VESTIBULAR TREATMENT     Patient Name: [name] [name] MRN: [id number] DOB:10-27-1940, 84 y.o., female Today's Date: 06/04/2024  END OF SESSION:  PT End of Session - 06/04/24 1021     Visit Number 2    Number of Visits 5    Date for Recertification  06/14/24    Authorization Type [employer]    PT Start Time 1019    PT Stop Time 1046   full time not used due to BPPV tx   PT Time Calculation (min) 27 min    Activity Tolerance Patient tolerated treatment well    Behavior During Therapy WFL for tasks assessed/performed          Past Medical History:  Diagnosis Date   Arthritis    Dizziness    Esophageal stricture    Gastroesophageal reflux disease    Hiatal hernia    Hiatal hernia    Varicose veins    Vertigo    chronic with exacerbation   Past Surgical History:  Procedure Laterality Date   BLADDER SUSPENSION     x 2   ENDOVENOUS ABLATION SAPHENOUS VEIN W/ LASER Right 09-05-2013   right greater saphenous vein by Krystal Doing MD   ENDOVENOUS ABLATION SAPHENOUS VEIN W/ LASER Left 10-18-2013   endovenous laser ablation left greater saphenous vein by Krystal Doing MD   VAGINAL HYSTERECTOMY     Patient Active Problem List   Diagnosis Date Noted   Acute metabolic encephalopathy 08/13/2021   Memory loss 08/13/2021   UTI (urinary tract infection) 08/13/2021   Varicose veins of bilateral lower extremities with other complications 05/11/2013   Swelling of limb 05/11/2013   Pure hypercholesterolemia 06/07/2011   Allergic rhinitis 06/26/2007   GASTROESOPHAGEAL REFLUX DISEASE 06/26/2007   Diaphragmatic hernia 06/26/2007   VERTIGO 06/26/2007   Dysphagia 06/26/2007   ESOPHAGEAL STRICTURE 05/03/2007    PCP: Arloa Elsie SAUNDERS, MD REFERRING PROVIDER: Arloa Elsie SAUNDERS, MD  REFERRING DIAG: R42 (ICD-10-CM) - Dizziness and giddiness  THERAPY DIAG:  BPPV (benign paroxysmal positional vertigo), right  Dizziness and giddiness  Unsteadiness on  feet  ONSET DATE: 04/24/2024  Rationale for Evaluation and Treatment: Rehabilitation  SUBJECTIVE:   SUBJECTIVE STATEMENT: My husband makes me dizzy. No falls. No imbalance.    Pt accompanied by: [name]  PERTINENT HISTORY: PMH: hx of BPPV, chronic vertigo, HLD, memory loss   PAIN:  Are you having pain? No   There were no vitals filed for this visit.    PRECAUTIONS: Other: hx of BPPV  FALLS: Has patient fallen in last 6 months? No  LIVING ENVIRONMENT: Lives with: lives with their spouse Lives in: House/apartment Stairs: Yes: External: 2 steps and 1 step from the garage steps; none Has following equipment at home: None  PLOF: Independent, enjoys reading  PATIENT GOALS: My husband wanted me to come Husband just wants pt to get checked out  OBJECTIVE:  Note: Objective measures were completed at Evaluation unless otherwise noted.  DIAGNOSTIC FINDINGS: No recent imaging   COGNITION: Overall cognitive status: Within functional limits for tasks assessed    POSTURE:  rounded shoulders  Cervical ROM:   Limited into cervical rotation and extension   GAIT: Gait pattern: WFL, slightly widened BOS Distance walked: Clinic distances  Assistive device utilized: None Level of assistance: Complete Independence Comments: Ambulates in and out of clinic independently with no issues    VESTIBULAR ASSESSMENT:  GENERAL OBSERVATION: Ambulates in independently.    SYMPTOM BEHAVIOR:  Subjective history: See above, husband provides a lot of subjective.   Non-Vestibular symptoms: N/A  Type of dizziness: imbalance, has to touch walls   Frequency: last episode was about a month ago  Duration: a minute or 2  Aggravating factors: being in a car that's driving too fast  Relieving factors: touching sometimes  Progression of symptoms: better  OCULOMOTOR EXAM:  Ocular Alignment: normal  Ocular ROM: No Limitations  Spontaneous Nystagmus: absent  Gaze-Induced Nystagmus:  absent  Smooth Pursuits: intact  Saccades: intact   VESTIBULAR - OCULAR REFLEX:   Slow VOR: Normal  VOR Cancellation: Normal  Head-Impulse Test: HIT Right: positive HIT Left: positive Pt with no symptoms and dizziness                                                            TREATMENT DATE: 06/04/24  Therapeutic Activity/NMR:  Performed with 4 risers under mat table due to limited cervical extension ROM for both DixHallpike and Epley   POSITIONAL TESTING: Right Dix-Hallpike: upbeating, right nystagmus and rotary lasting approx 10-15 seconds, not as intense as previous session   Canalith Repositioning:  Epley Right: Number of Reps: 3, Response to Treatment: comment: did not get to re-assess at end of session due to time constraints, but pt tolerated well, and Comment: dizziness in 1st and 3rd positions and with return to upright, pt's spouse helping to roll pt   Seated rest breaks performed between each maneuver to allow sx to subside. After 3 reps, pt reporting that she has had enough maneuvers and does not wish to do any more.   Provided BPPV education and reoccurrence and treatment with Epley maneuver   PATIENT EDUCATION: Education details: BPPV education, treating with Epley maneuver  Person educated: Patient and Spouse Education method: Medical Illustrator Education comprehension: verbalized understanding and needs further education  HOME EXERCISE PROGRAM: Will review from previous POC as appropriate   GOALS: Goals reviewed with patient? Yes  SHORT TERM GOALS: ALL STGS = LTGS  LONG TERM GOALS: Target date: 06/12/2024  Pt will demo negative R posterior canal BPPV in order to demo decr dizziness/imbalance Baseline: (+) R posterior canalithiasis  Goal status: INITIAL  2.  Further balance to be assessed as needed and goal written.  Baseline:  Goal status: INITIAL    ASSESSMENT:  CLINICAL IMPRESSION: Today's skilled session focused on re-assessing R  posterior canalithiasis. Pt continued with R upbeating rotary nystagmus lasting approx. 10-15 seconds and not as intense as previous session. Treated with x3 reps of the Epley maneuver with use of 4 risers due to limited cervical extension. Pt reporting slight dizziness at the end of each maneuver. Did not get to re-assess at end of session as pt reports that she does not want to do any further maneuvers. Pt able to ambulate at end of session with no issues or dizziness. Will continue per POC.    OBJECTIVE IMPAIRMENTS: decreased balance, dizziness, and postural dysfunction.   ACTIVITY LIMITATIONS: bending, bed mobility, and locomotion level  PARTICIPATION LIMITATIONS: community activity  PERSONAL FACTORS: Age, Behavior pattern, Past/current experiences, Time since onset of injury/illness/exacerbation, and 3+ comorbidities: hx of BPPV, chronic vertigo, HLD, memory loss  are also affecting patient's functional outcome.   REHAB POTENTIAL: Good  CLINICAL DECISION MAKING: Stable/uncomplicated  EVALUATION COMPLEXITY:  Low   PLAN:  PT FREQUENCY: 1-2x/week  PT DURATION: 4 weeks  PLANNED INTERVENTIONS: 97164- PT Re-evaluation, 97110-Therapeutic exercises, 97530- Therapeutic activity, 97112- Neuromuscular re-education, 97535- Self Care, [id number]- Manual therapy, [phone]- Canalith repositioning, Patient/Family education, Balance training, and Vestibular training  PLAN FOR NEXT SESSION: re-check R posterior canal BPPV and treat, perform with 4 risers under mat table to due limited extension ROM    [place]ffield LOISE Senate, PT, DPT 06/04/2024, 10:58 AM  "

## 2024-06-07 ENCOUNTER — Ambulatory Visit: Admitting: Physical Therapy

## 2024-06-07 ENCOUNTER — Encounter: Payer: Self-pay | Admitting: Physical Therapy

## 2024-06-07 DIAGNOSIS — R42 Dizziness and giddiness: Secondary | ICD-10-CM

## 2024-06-07 DIAGNOSIS — H8111 Benign paroxysmal vertigo, right ear: Secondary | ICD-10-CM

## 2024-06-07 DIAGNOSIS — R2681 Unsteadiness on feet: Secondary | ICD-10-CM

## 2024-06-07 NOTE — Progress Notes (Signed)
 " OUTPATIENT PHYSICAL THERAPY VESTIBULAR TREATMENT     Patient Name: [name] [name] MRN: [id number] DOB:12/01/1940, 84 y.o., female Today's Date: 06/07/2024  END OF SESSION:  PT End of Session - 06/07/24 1016     Visit Number 3    Number of Visits 5    Date for Recertification  06/14/24    Authorization Type [employer] MEDICARE    PT Start Time 1015    PT Stop Time 1053    PT Time Calculation (min) 38 min    Activity Tolerance Patient tolerated treatment well    Behavior During Therapy WFL for tasks assessed/performed          Past Medical History:  Diagnosis Date   Arthritis    Dizziness    Esophageal stricture    Gastroesophageal reflux disease    Hiatal hernia    Hiatal hernia    Varicose veins    Vertigo    chronic with exacerbation   Past Surgical History:  Procedure Laterality Date   BLADDER SUSPENSION     x 2   ENDOVENOUS ABLATION SAPHENOUS VEIN W/ LASER Right 09-05-2013   right greater saphenous vein by Krystal Doing MD   ENDOVENOUS ABLATION SAPHENOUS VEIN W/ LASER Left 10-18-2013   endovenous laser ablation left greater saphenous vein by Krystal Doing MD   VAGINAL HYSTERECTOMY     Patient Active Problem List   Diagnosis Date Noted   Acute metabolic encephalopathy 08/13/2021   Memory loss 08/13/2021   UTI (urinary tract infection) 08/13/2021   Varicose veins of bilateral lower extremities with other complications 05/11/2013   Swelling of limb 05/11/2013   Pure hypercholesterolemia 06/07/2011   Allergic rhinitis 06/26/2007   GASTROESOPHAGEAL REFLUX DISEASE 06/26/2007   Diaphragmatic hernia 06/26/2007   VERTIGO 06/26/2007   Dysphagia 06/26/2007   ESOPHAGEAL STRICTURE 05/03/2007    PCP: Arloa Elsie SAUNDERS, MD REFERRING PROVIDER: Arloa Elsie SAUNDERS, MD  REFERRING DIAG: R42 (ICD-10-CM) - Dizziness and giddiness  THERAPY DIAG:  BPPV (benign paroxysmal positional vertigo), right  Dizziness and giddiness  Unsteadiness on feet  ONSET DATE:  04/24/2024  Rationale for Evaluation and Treatment: Rehabilitation  SUBJECTIVE:   SUBJECTIVE STATEMENT: Reports more of an unsteadiness, does not call it dizziness.   Pt accompanied by: [name]  PERTINENT HISTORY: PMH: hx of BPPV, chronic vertigo, HLD, memory loss   PAIN:  Are you having pain? No   There were no vitals filed for this visit.    PRECAUTIONS: Other: hx of BPPV  FALLS: Has patient fallen in last 6 months? No  LIVING ENVIRONMENT: Lives with: lives with their spouse Lives in: House/apartment Stairs: Yes: External: 2 steps and 1 step from the garage steps; none Has following equipment at home: None  PLOF: Independent, enjoys reading  PATIENT GOALS: My husband wanted me to come Husband just wants pt to get checked out  OBJECTIVE:  Note: Objective measures were completed at Evaluation unless otherwise noted.  DIAGNOSTIC FINDINGS: No recent imaging   COGNITION: Overall cognitive status: Within functional limits for tasks assessed    POSTURE:  rounded shoulders  Cervical ROM:   Limited into cervical rotation and extension   GAIT: Gait pattern: WFL, slightly widened BOS Distance walked: Clinic distances  Assistive device utilized: None Level of assistance: Complete Independence Comments: Ambulates in and out of clinic independently with no issues    VESTIBULAR ASSESSMENT:  GENERAL OBSERVATION: Ambulates in independently.    SYMPTOM BEHAVIOR:  Subjective history: See above, husband provides a lot  of subjective.   Non-Vestibular symptoms: N/A  Type of dizziness: imbalance, has to touch walls   Frequency: last episode was about a month ago  Duration: a minute or 2  Aggravating factors: being in a car that's driving too fast  Relieving factors: touching sometimes  Progression of symptoms: better  OCULOMOTOR EXAM:  Ocular Alignment: normal  Ocular ROM: No Limitations  Spontaneous Nystagmus: absent  Gaze-Induced Nystagmus:  absent  Smooth Pursuits: intact  Saccades: intact   VESTIBULAR - OCULAR REFLEX:   Slow VOR: Normal  VOR Cancellation: Normal  Head-Impulse Test: HIT Right: positive HIT Left: positive Pt with no symptoms and dizziness                                                            TREATMENT DATE: 06/07/24  Therapeutic Activity/NMR:  Performed with 6 risers under mat table due to limited cervical extension ROM for both DixHallpike and Epley   POSITIONAL TESTING: Right Dix-Hallpike: upbeating, right nystagmus and lasting approx 10 seconds, very mild sx and low amplitude    Canalith Repositioning:  Epley Right: Number of Reps: 3, Response to Treatment: comment: did not get to re-assess at end of session due to time constraints, but pt tolerated well, and Comment: dizziness in 1st position and with return to upright, pt's spouse helping to roll pt   Seated rest breaks performed between each maneuver to allow sx to subside.  Provided BPPV education and reoccurrence and treatment with Epley maneuver, provided multiple times during session due to pt's cognitive impairments   Performed Wilhelmena Carrel exercises that pt can perform at home for very mild lingering BPPV.  Performed R sided [employer] x2 reps, pt with no nystagmus but did have very little dizziness. Provided as HEP, pt reporting feeling much better at end of session.   PATIENT EDUCATION: Education details: BPPV education, treating with Epley maneuver, R sided Wilhelmena Carrel for HEP Person educated: Patient and Spouse Education method: Medical Illustrator Education comprehension: verbalized understanding and needs further education  HOME EXERCISE PROGRAM: R sided Wilhelmena Carrel   GOALS: Goals reviewed with patient? Yes  SHORT TERM GOALS: ALL STGS = LTGS  LONG TERM GOALS: Target date: 06/12/2024  Pt will demo negative R posterior canal BPPV in order to demo decr dizziness/imbalance Baseline: (+) R posterior  canalithiasis  Goal status: INITIAL  2.  Further balance to be assessed as needed and goal written.  Baseline:  Goal status: INITIAL    ASSESSMENT:  CLINICAL IMPRESSION: Today's skilled session focused on re-assessing R posterior canalithiasis. Pt continued with R upbeating rotary nystagmus lasting approx. 10 seconds with very low amplitude and pt reporting no much dizziness. Treated with x3 reps of the Epley maneuver with use of 6 risers this time instead of 4 due to limited cervical extension. Pt reporting slight dizziness at the end of each maneuver. Provided R sided Wilhelmena Carrel exercises for HEP as pt with very minimal dizziness, but no nystagmus in R sidelying. Pt tolerated session well and overall reported feeling much better. Will continue per POC.    OBJECTIVE IMPAIRMENTS: decreased balance, dizziness, and postural dysfunction.   ACTIVITY LIMITATIONS: bending, bed mobility, and locomotion level  PARTICIPATION LIMITATIONS: community activity  PERSONAL FACTORS: Age, Behavior pattern, Past/current experiences, Time since onset of  injury/illness/exacerbation, and 3+ comorbidities: hx of BPPV, chronic vertigo, HLD, memory loss  are also affecting patient's functional outcome.   REHAB POTENTIAL: Good  CLINICAL DECISION MAKING: Stable/uncomplicated  EVALUATION COMPLEXITY: Low   PLAN:  PT FREQUENCY: 1-2x/week  PT DURATION: 4 weeks  PLANNED INTERVENTIONS: 97164- PT Re-evaluation, 97110-Therapeutic exercises, 97530- Therapeutic activity, 97112- Neuromuscular re-education, 97535- Self Care, [id number]- Manual therapy, [phone]- Canalith repositioning, Patient/Family education, Balance training, and Vestibular training  PLAN FOR NEXT SESSION: re-check R posterior canal BPPV and treat, perform with 4 risers under mat table to due limited extension ROM    Sheffield LOISE Senate, PT, DPT 06/07/2024, 10:56 AM  "

## 2024-06-11 ENCOUNTER — Ambulatory Visit: Admitting: Physical Therapy

## 2024-06-12 ENCOUNTER — Ambulatory Visit: Admitting: Physical Therapy

## 2024-06-12 ENCOUNTER — Encounter: Payer: Self-pay | Admitting: Physical Therapy

## 2024-06-12 DIAGNOSIS — R42 Dizziness and giddiness: Secondary | ICD-10-CM

## 2024-06-12 DIAGNOSIS — H8111 Benign paroxysmal vertigo, right ear: Secondary | ICD-10-CM

## 2024-06-12 NOTE — Therapy (Signed)
 " OUTPATIENT PHYSICAL THERAPY VESTIBULAR TREATMENT     Patient Name: [name] [name] MRN: [id number] DOB:[birth year], 84 y.o., female Today's Date: 06/12/2024  END OF SESSION:  PT End of Session - 06/12/24 1015     Visit Number 4    Number of Visits 5    Date for Recertification  06/14/24    Authorization Type [employer]IC[employer]    PT Start Time 1014    PT Stop Time 1052    PT Time Calculation (min) 38 min    Activity Tolerance Patient tolerated treatment well    Behavior During Therapy WFL for tasks assessed/performed          Past Medical History:  Diagnosis Date   Arthritis    Dizziness    Esophageal stricture    Gastroesophageal reflux disease    Hiatal hernia    Hiatal hernia    Varicose veins    Vertigo    chronic with exacerbation   Past Surgical History:  Procedure Laterality Date   BLADDER SUSPENSION     x 2   ENDOVENOUS ABLATION SAPHENOUS VEIN W/ LASER Right 09-05-2013   right greater saphenous vein by Krystal Doing MD   ENDOVENOUS ABLATION SAPHENOUS VEIN W/ LASER Left 10-18-2013   endovenous laser ablation left greater saphenous vein by Krystal Doing MD   VAGINAL HYSTERECTOMY     Patient Active Problem List   Diagnosis Date Noted   Acute metabolic encephalopathy 08/13/2021   Memory loss 08/13/2021   UTI (urinary tract infection) 08/13/2021   Varicose veins of bilateral lower extremities with other complications 05/11/2013   Swelling of limb 05/11/2013   Pure hypercholesterolemia 06/07/2011   Allergic rhinitis 06/26/2007   GASTROESOPHAGEAL REFLUX DISEASE 06/26/2007   Diaphragmatic hernia 06/26/2007   VERTIGO 06/26/2007   Dysphagia 06/26/2007   ESOPHAGEAL STRICTURE 05/03/2007    PCP: Arloa Elsie SAUNDERS, MD REFERRING PROVIDER: Arloa Elsie SAUNDERS, MD  REFERRING DIAG: R42 (ICD-10-CM) - Dizziness and giddiness  THERAPY DIAG:  BPPV (benign paroxysmal positional vertigo), right  Dizziness and giddiness  ONSET DATE: 04/24/2024  Rationale for Evaluation and  Treatment: Rehabilitation  SUBJECTIVE:   SUBJECTIVE STATEMENT: Has not really been feeling off balance, nothing to speak of. Tried the [employer] exercise, but did not really like it.   Pt accompanied by: [name]  PERTINENT HISTORY: PMH: hx of BPPV, chronic vertigo, HLD, memory loss   PAIN:  Are you having pain? No   There were no vitals filed for this visit.    PRECAUTIONS: Other: hx of BPPV  FALLS: Has patient fallen in last 6 months? No  LIVING ENVIRONMENT: Lives with: lives with their spouse Lives in: House/apartment Stairs: Yes: External: 2 steps and 1 step from the garage steps; none Has following equipment at home: None  PLOF: Independent, enjoys reading  PATIENT GOALS: My husband wanted me to come Husband just wants pt to get checked out  OBJECTIVE:  Note: Objective measures were completed at Evaluation unless otherwise noted.  DIAGNOSTIC FINDINGS: No recent imaging   COGNITION: Overall cognitive status: Within functional limits for tasks assessed    POSTURE:  rounded shoulders  Cervical ROM:   Limited into cervical rotation and extension   GAIT: Gait pattern: WFL, slightly widened BOS Distance walked: Clinic distances  Assistive device utilized: None Level of assistance: Complete Independence Comments: Ambulates in and out of clinic independently with no issues    VESTIBULAR ASSESSMENT:  GENERAL OBSERVATION: Ambulates in independently.    SYMPTOM BEHAVIOR:  Subjective history: See above, husband provides a lot of subjective.   Non-Vestibular symptoms: N/A  Type of dizziness: imbalance, has to touch walls   Frequency: last episode was about a month ago  Duration: a minute or 2  Aggravating factors: being in a car that's driving too fast  Relieving factors: touching sometimes  Progression of symptoms: better  OCULOMOTOR EXAM:  Ocular Alignment: normal  Ocular ROM: No Limitations  Spontaneous Nystagmus: absent  Gaze-Induced  Nystagmus: absent  Smooth Pursuits: intact  Saccades: intact   VESTIBULAR - OCULAR REFLEX:   Slow VOR: Normal  VOR Cancellation: Normal  Head-Impulse Test: HIT Right: positive HIT Left: positive Pt with no symptoms and dizziness                                                            TREATMENT DATE: 06/12/24  Therapeutic Activity/NMR:  Performed with 4 risers under mat table due to limited cervical extension ROM for both DixHallpike and Epley   POSITIONAL TESTING: Right Dix-Hallpike: upbeating, right nystagmus and lasting approx 8 seconds, very mild sx and low amplitude    Canalith Repositioning:  Epley Right: Number of Reps: 4, Response to Treatment: comment: less nystagmus noted after each rep, only a few bouts of nystagmus noted before 4th rep of the Epley, and Comment: dizziness in 1st position and with return to upright, PT student helping to roll pt, utilized mastoid vibration in 1st and 3rd positions. Tried to hold each position for a little longer    Seated rest breaks performed between each maneuver to allow sx to subside.  Continued to provide BPPV education and reoccurrence and treatment with Epley maneuver,  PATIENT EDUCATION: Education details: BPPV education, treating with Epley maneuver, continue R sided Wilhelmen[employer] Carrel for HEP, anticipate D/C at next session Person educated: Patient and Spouse Education method: Medical Illustrator Education comprehension: verbalized understanding and needs further education  HOME EXERCISE PROGRAM: R sided Wilhelmena Carrel   GOALS: Goals reviewed with patient? Yes  SHORT TERM GOALS: ALL STGS = LTGS  LONG TERM GOALS: Target date: 06/12/2024  Pt will demo negative R posterior canal BPPV in order to demo decr dizziness/imbalance Baseline: (+) R posterior canalithiasis  Goal status: INITIAL  2.  Further balance to be assessed as needed and goal written.  Baseline:  Goal status: INITIAL    ASSESSMENT:  CLINICAL  IMPRESSION: Today's skilled session focused on re-assessing R posterior canalithiasis. Pt continued with R upbeating rotary nystagmus lasting approx. 8-10 seconds with very low amplitude and pt reporting a little dizziness. Treated with x4 reps of the Epley maneuver with use 4 risers due to limited cervical extension. Tried holding each position a little longer and utilized mastoid vibration in 1st and 3rd positions. When performing 4th Epley maneuver, pt only with a few bouts of nystagmus with pt reporting not really any sx. Discussed to try the Wilhelmena Carrel again at home tomorrow before returning on Thursday. Anticipate D/C at that time with pt and pt's spouse in agreement with plan.    OBJECTIVE IMPAIRMENTS: decreased balance, dizziness, and postural dysfunction.   ACTIVITY LIMITATIONS: bending, bed mobility, and locomotion level  PARTICIPATION LIMITATIONS: community activity  PERSONAL FACTORS: Age, Behavior pattern, Past/current experiences, Time since onset of injury/illness/exacerbation, and 3+ comorbidities: hx of BPPV, chronic  vertigo, HLD, memory loss  are also affecting patient's functional outcome.   REHAB POTENTIAL: Good  CLINICAL DECISION MAKING: Stable/uncomplicated  EVALUATION COMPLEXITY: Low   PLAN:  PT FREQUENCY: 1-2x/week  PT DURATION: 4 weeks  PLANNED INTERVENTIONS: 97164- PT Re-evaluation, 97110-Therapeutic exercises, 97530- Therapeutic activity, 97112- Neuromuscular re-education, 97535- Self Care, 02859- Manual therapy, [phone]- Canalith repositioning, Patient/Family education, Balance training, and Vestibular training  PLAN FOR NEXT SESSION: re-check R posterior canal BPPV and treat, perform with 4 risers under mat table to due limited extension ROM    Harlo Jaso N Pluma Diniz, PT, DPT 06/12/2024, 11:07 AM  "

## 2024-06-14 ENCOUNTER — Ambulatory Visit: Admitting: Physical Therapy

## 2024-06-18 ENCOUNTER — Encounter: Payer: Self-pay | Admitting: Podiatry

## 2024-06-18 ENCOUNTER — Ambulatory Visit: Admitting: Podiatry

## 2024-06-18 ENCOUNTER — Ambulatory Visit: Admitting: Physical Therapy

## 2024-06-18 ENCOUNTER — Encounter: Payer: Self-pay | Admitting: Physical Therapy

## 2024-06-18 DIAGNOSIS — L84 Corns and callosities: Secondary | ICD-10-CM | POA: Diagnosis not present

## 2024-06-18 DIAGNOSIS — R42 Dizziness and giddiness: Secondary | ICD-10-CM

## 2024-06-18 DIAGNOSIS — R2681 Unsteadiness on feet: Secondary | ICD-10-CM

## 2024-06-18 DIAGNOSIS — H8111 Benign paroxysmal vertigo, right ear: Secondary | ICD-10-CM

## 2024-06-18 NOTE — Therapy (Signed)
 " OUTPATIENT PHYSICAL THERAPY VESTIBULAR TREATMENT/DISCHARGE SUMMARY     Patient Name: [name] [name] MRN: [id number] DOB:09/08/1940, 84 y.o., female Today's Date: 06/18/2024  PHYSICAL THERAPY DISCHARGE SUMMARY  Visits from Start of Care: 5  Current functional level related to goals / functional outcomes: See LTGs/Clinical Assessment Statement    Remaining deficits: BPPV resolved on 1/19, however pt with chronic BPPV    Education / Equipment: HEP, BPPV education    Patient agrees to discharge. Patient goals were met. Patient is being discharged due to meeting the stated rehab goals., pt no longer feeling dizzy and resolution of BPPV    END OF SESSION:  PT End of Session - 06/18/24 1450     Visit Number 5    Number of Visits 5    Date for Recertification  06/18/24    Authorization Type [employer]IC[employer]    PT Start Time 1448    PT Stop Time 1512   full time not used due to D/C visit   PT Time Calculation (min) 24 min    Activity Tolerance Patient tolerated treatment well    Behavior During Therapy WFL for tasks assessed/performed          Past Medical History:  Diagnosis Date   Arthritis    Dizziness    Esophageal stricture    Gastroesophageal reflux disease    Hiatal hernia    Hiatal hernia    Varicose veins    Vertigo    chronic with exacerbation   Past Surgical History:  Procedure Laterality Date   BLADDER SUSPENSION     x 2   ENDOVENOUS ABLATION SAPHENOUS VEIN W/ LASER Right 09-05-2013   right greater saphenous vein by Krystal Doing MD   ENDOVENOUS ABLATION SAPHENOUS VEIN W/ LASER Left 10-18-2013   endovenous laser ablation left greater saphenous vein by Krystal Doing MD   VAGINAL HYSTERECTOMY     Patient Active Problem List   Diagnosis Date Noted   Acute metabolic encephalopathy 08/13/2021   Memory loss 08/13/2021   UTI (urinary tract infection) 08/13/2021   Varicose veins of bilateral lower extremities with other complications 05/11/2013   Swelling of  limb 05/11/2013   Pure hypercholesterolemia 06/07/2011   Allergic rhinitis 06/26/2007   GASTROESOPHAGEAL REFLUX DISEASE 06/26/2007   Diaphragmatic hernia 06/26/2007   VERTIGO 06/26/2007   Dysphagia 06/26/2007   ESOPHAGEAL STRICTURE 05/03/2007    PCP: Arloa Elsie SAUNDERS, MD REFERRING PROVIDER: Arloa Elsie SAUNDERS, MD  REFERRING DIAG: R42 (ICD-10-CM) - Dizziness and giddiness  THERAPY DIAG:  BPPV (benign paroxysmal positional vertigo), right  Dizziness and giddiness  Unsteadiness on feet  ONSET DATE: 04/24/2024  Rationale for Evaluation and Treatment: Rehabilitation  SUBJECTIVE:   SUBJECTIVE STATEMENT: Does not do the exercises at home because sometimes they make her a little dizzy. No dizziness or imbalance. Has been feeling much better.   Pt accompanied by: [name] [name]  PERTINENT HISTORY: PMH: hx of BPPV, chronic vertigo, HLD, memory loss   PAIN:  Are you having pain? No   There were no vitals filed for this visit.    PRECAUTIONS: Other: hx of BPPV  FALLS: Has patient fallen in last 6 months? No  LIVING ENVIRONMENT: Lives with: lives with their spouse Lives in: House/apartment Stairs: Yes: External: 2 steps and 1 step from the garage steps; none Has following equipment at home: None  PLOF: Independent, enjoys reading  PATIENT GOALS: My husband wanted me to come Husband just wants pt to get checked out  OBJECTIVE:  Note: Objective measures were completed at Evaluation unless otherwise noted.  DIAGNOSTIC FINDINGS: No recent imaging   COGNITION: Overall cognitive status: Within functional limits for tasks assessed    POSTURE:  rounded shoulders  Cervical ROM:   Limited into cervical rotation and extension   GAIT: Gait pattern: WFL, slightly widened BOS Distance walked: Clinic distances  Assistive device utilized: None Level of assistance: Complete Independence Comments: Ambulates in and out of clinic independently with no issues     VESTIBULAR ASSESSMENT:  GENERAL OBSERVATION: Ambulates in independently.    SYMPTOM BEHAVIOR:  Subjective history: See above, husband provides a lot of subjective.   Non-Vestibular symptoms: N/A  Type of dizziness: imbalance, has to touch walls   Frequency: last episode was about a month ago  Duration: a minute or 2  Aggravating factors: being in a car that's driving too fast  Relieving factors: touching sometimes  Progression of symptoms: better  OCULOMOTOR EXAM:  Ocular Alignment: normal  Ocular ROM: No Limitations  Spontaneous Nystagmus: absent  Gaze-Induced Nystagmus: absent  Smooth Pursuits: intact  Saccades: intact   VESTIBULAR - OCULAR REFLEX:   Slow VOR: Normal  VOR Cancellation: Normal  Head-Impulse Test: HIT Right: positive HIT Left: positive Pt with no symptoms and dizziness                                                            TREATMENT DATE: 06/18/24  Therapeutic Activity/NMR:  Performed with 4 risers under mat table due to limited cervical extension ROM for both DixHallpike and Epley   POSITIONAL TESTING: Right Dix-Hallpike: upbeating, right nystagmus and approx 4-5 beats, very low amplitude, pt reporting no dizziness     Canalith Repositioning:  Epley Right: Number of Reps: 2, Response to Treatment: comment: symptoms resolved after 2 reps, and Comment: dizziness in 1st position and with return to upright, PT student helping to roll pt, utilized mastoid vibration in 1st and 3rd positions. Tried to hold each position for a little longer    Seated rest breaks performed between each maneuver to allow sx to subside.  Continued to provide BPPV education and reoccurrence. Discussed if sx do come back in the future, then can try performing Wilhelmena Carrel exercises at home. Otherwise, will need new PT referral to come back and have it treated at this clinic.   PATIENT EDUCATION: Education details: See above, D/C from PT at this time  Person educated:  Patient and Spouse Education method: Medical Illustrator Education comprehension: verbalized understanding and needs further education  HOME EXERCISE PROGRAM: R sided Wilhelmena Carrel   GOALS: Goals reviewed with patient? Yes  SHORT TERM GOALS: ALL STGS = LTGS  LONG TERM GOALS: Target date: 06/12/2024  Pt will demo negative R posterior canal BPPV in order to demo decr dizziness/imbalance Baseline: (+) R posterior canalithiasis   Neg on 1/19  Goal status: MET  2.  Further balance to be assessed as needed and goal written.  Baseline: N/A goal not needed  Goal status: N/A    ASSESSMENT:  CLINICAL IMPRESSION: Today's skilled session focused on assessing R posterior canalithiasis. With initial assessment, pt did demo a few beats of R upbeating rotary nystagmus, but pt did not report any dizziness in this position. Performed 2 reps of the Epley maneuver with use of  4 risers and mastoid vibration in 1st and 3rd positions. Pt demonstrated resolution after 2 reps. Pt overall doing much better and reports significant improvements in balance and dizziness. 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 Educated if it does return, then can try the R Wil[employer] Carrel at home or can get a new referral to return. Pt and pt's spouse in agreement with plan and D/C.    OBJECTIVE IMPAIRMENTS: decreased balance, dizziness, and postural dysfunction.   ACTIVITY LIMITATIONS: bending, bed mobility, and locomotion level  PARTICIPATION LIMITATIONS: community activity  PERSONAL FACTORS: Age, Behavior pattern, Past/current experiences, Time since onset of injury/illness/exacerbation, and 3+ comorbidities: hx of BPPV, chronic vertigo, HLD, memory loss  are also affecting patient's functional outcome.   REHAB POTENTIAL: Good  CLINICAL DECISION MAKING: Stable/uncomplicated  EVALUATION COMPLEXITY: Low   PLAN:  PT FREQUENCY: 1-2x/week  PT DURATION: 4 weeks  PLANNED  INTERVENTIONS: 97164- PT Re-evaluation, 97110-Therapeutic exercises, 97530- Therapeutic activity, [id number]- Neuromuscular re-education, 97535- Self Care, 02859- Manual therapy, [phone]- Canalith repositioning, Patient/Family education, Balance training, and Vestibular training  PLAN FOR NEXT SESSION: D/C   [place] LOISE Senate, PT, DPT 06/18/2024, 3:18 PM  "

## 2024-06-18 NOTE — Progress Notes (Signed)
 Subjective:   Patient ID: [name] [name], female   DOB: 84 y.o.   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
